# Patient Record
Sex: Male | Born: 1995 | Race: White | Hispanic: No | Marital: Married | Smoking: Never smoker
Health system: Southern US, Community
[De-identification: ages and names within clinical notes are randomized; demographics above are authoritative.]

## PROBLEM LIST (undated history)

## (undated) DIAGNOSIS — K5090 Crohn's disease, unspecified, without complications: Secondary | ICD-10-CM

## (undated) DIAGNOSIS — T7840XA Allergy, unspecified, initial encounter: Secondary | ICD-10-CM

## (undated) DIAGNOSIS — J45909 Unspecified asthma, uncomplicated: Secondary | ICD-10-CM

## (undated) HISTORY — DX: Crohn's disease, unspecified, without complications: K50.90

## (undated) HISTORY — DX: Allergy, unspecified, initial encounter: T78.40XA

---

## 2017-10-12 ENCOUNTER — Inpatient Hospital Stay
Admit: 2017-10-12 | Discharge: 2017-10-12 | Disposition: A | Discharge status: Home / Self Care | Payer: PRIVATE HEALTH INSURANCE

## 2017-10-12 ENCOUNTER — Emergency Department: Admit: 2017-10-12 | Payer: PRIVATE HEALTH INSURANCE

## 2017-10-12 DIAGNOSIS — K8020 Calculus of gallbladder without cholecystitis without obstruction: Secondary | ICD-10-CM

## 2017-10-12 LAB — CBC WITH AUTO DIFFERENTIAL
Basophils %: 0.2 %
Basophils Absolute: 0 10*3/uL (ref 0.0–0.2)
Eosinophils %: 0.1 %
Eosinophils Absolute: 0 10*3/uL (ref 0.0–0.6)
Hematocrit: 50.3 % (ref 40.5–52.5)
Hemoglobin: 17.1 g/dL (ref 13.5–17.5)
Lymphocytes %: 7.4 %
Lymphocytes Absolute: 1.2 10*3/uL (ref 1.0–5.1)
MCH: 29.4 pg (ref 26.0–34.0)
MCHC: 34 g/dL (ref 31.0–36.0)
MCV: 86.4 fL (ref 80.0–100.0)
MPV: 8.9 fL (ref 5.0–10.5)
Monocytes %: 3.7 %
Monocytes Absolute: 0.6 10*3/uL (ref 0.0–1.3)
Neutrophils %: 88.6 %
Neutrophils Absolute: 14.4 10*3/uL — ABNORMAL HIGH (ref 1.7–7.7)
Platelets: 292 10*3/uL (ref 135–450)
RBC: 5.82 M/uL (ref 4.20–5.90)
RDW: 12.1 % — ABNORMAL LOW (ref 12.4–15.4)
WBC: 16.3 10*3/uL — ABNORMAL HIGH (ref 4.0–11.0)

## 2017-10-12 LAB — COMPREHENSIVE METABOLIC PANEL
ALT: 18 U/L (ref 10–40)
AST: 14 U/L — ABNORMAL LOW (ref 15–37)
Albumin/Globulin Ratio: 1.5 (ref 1.1–2.2)
Albumin: 4.8 g/dL (ref 3.4–5.0)
Alkaline Phosphatase: 82 U/L (ref 40–129)
Anion Gap: 12 (ref 3–16)
BUN: 14 mg/dL (ref 7–20)
CO2: 24 mmol/L (ref 21–32)
Calcium: 9.8 mg/dL (ref 8.3–10.6)
Chloride: 102 mmol/L (ref 99–110)
Creatinine: 0.7 mg/dL — ABNORMAL LOW (ref 0.9–1.3)
GFR African American: >60 (ref >60)
GFR Non-African American: >60 (ref >60)
Globulin: 3.1 g/dL
Glucose: 104 mg/dL — ABNORMAL HIGH (ref 70–99)
Potassium: 4.4 mmol/L (ref 3.5–5.1)
Sodium: 138 mmol/L (ref 136–145)
Total Bilirubin: 0.4 mg/dL (ref 0.0–1.0)
Total Protein: 7.9 g/dL (ref 6.4–8.2)

## 2017-10-12 LAB — LIPASE: Lipase: 20 U/L (ref 13.0–60.0)

## 2017-10-12 MED ORDER — FAMOTIDINE 20 MG/2ML IV SOLN
20 MG/2ML | Freq: Once | INTRAVENOUS | Status: AC
Start: 2017-10-12 — End: 2017-10-12
  Administered 2017-10-12: 17:00:00 20 mg via INTRAVENOUS

## 2017-10-12 MED ORDER — SODIUM CHLORIDE 0.9 % IV BOLUS
0.9 % | Freq: Once | INTRAVENOUS | Status: AC
Start: 2017-10-12 — End: 2017-10-12
  Administered 2017-10-12: 17:00:00 1000 mL via INTRAVENOUS

## 2017-10-12 MED ORDER — NAPROXEN 500 MG PO TABS
500 MG | ORAL_TABLET | Freq: Two times a day (BID) | ORAL | 0 refills | Status: AC
Start: 2017-10-12 — End: 2017-10-22

## 2017-10-12 MED ORDER — LIDOCAINE VISCOUS 2 % MT SOLN
2 % | Freq: Once | OROMUCOSAL | Status: AC
Start: 2017-10-12 — End: 2017-10-12
  Administered 2017-10-12: 17:00:00 via ORAL

## 2017-10-12 MED ORDER — ONDANSETRON 4 MG PO TBDP
4 MG | ORAL_TABLET | Freq: Three times a day (TID) | ORAL | 0 refills | Status: AC | PRN
Start: 2017-10-12 — End: ?

## 2017-10-12 MED ORDER — ONDANSETRON HCL 4 MG/2ML IJ SOLN
4 MG/2ML | Freq: Once | INTRAMUSCULAR | Status: AC
Start: 2017-10-12 — End: 2017-10-12
  Administered 2017-10-12: 17:00:00 4 mg via INTRAVENOUS

## 2017-10-12 MED ORDER — KETOROLAC TROMETHAMINE 30 MG/ML IJ SOLN
30 MG/ML | Freq: Once | INTRAMUSCULAR | Status: AC
Start: 2017-10-12 — End: 2017-10-12
  Administered 2017-10-12: 17:00:00 30 mg via INTRAVENOUS

## 2017-10-12 MED ORDER — FAMOTIDINE 20 MG PO TABS
20 MG | ORAL_TABLET | Freq: Two times a day (BID) | ORAL | 0 refills | Status: AC
Start: 2017-10-12 — End: ?

## 2017-10-12 MED FILL — KETOROLAC TROMETHAMINE 30 MG/ML IJ SOLN: 30 mg/mL | INTRAMUSCULAR | Qty: 1

## 2017-10-12 MED FILL — SODIUM CHLORIDE 0.9 % IV SOLN: 0.9 % | INTRAVENOUS | Qty: 1000

## 2017-10-12 MED FILL — ONDANSETRON HCL 4 MG/2ML IJ SOLN: 4 MG/2ML | INTRAMUSCULAR | Qty: 2

## 2017-10-12 MED FILL — MAG-AL PLUS 200-200-20 MG/5ML PO LIQD: 200-200-20 MG/5ML | ORAL | Qty: 30

## 2017-10-12 MED FILL — FAMOTIDINE 20 MG/2ML IV SOLN: 20 MG/2ML | INTRAVENOUS | Qty: 2

## 2017-10-12 NOTE — ED Notes (Signed)
Verbal and written discharge instructions given. IV removed. Prescriptions given to patient.  Patient in stable condition, discharged home.     Drue FlirtKayla Lael Wetherbee, RN  10/12/17 [phone]

## 2017-10-12 NOTE — ED Provider Notes (Signed)
I independently performed a history and physical on Kevin Yoder.   All diagnostic, treatment, and disposition decisions were made by myself in conjunction with the mid-level provider.  I am the provider of record for this encounter.    For further details of Kevin Yoder's emergency department encounter, please see Aundra MilletMegan Burriss, NP's documentation.    Patient presents for evaluation of upper abdominal pain for past several hours.  Drove himself in today.  States that has had similar discomfort on right side of abdomen for which she was admitted to hospital in [place] 2 months ago however reports that nothing was definitively found.  States that abdominal pain began around 3 AM.  Reports seeing some taquitos just prior to going to bed.  Pain sharp and stabbing in nature.  Nauseous without vomiting.  No diarrhea or constipation.  No fevers chills.  No history of abdominal surgeries.  Physical exam of right upper quadrant tenderness.  No obvious hernias or masses.  No CVA tenderness.  Heart regular rate and rhythm without murmur, rub, or gallop.  Lungs are clear to auscultation bilaterally without wheezes, rhonchi or rales.  Patient does have a leukocytosis at 16.3 with left shift and no bands.  Right upper quadrant ultrasound with cholelithiasis without evidence to suggest cholecystitis.  On reevaluation patient feeling significantly better.  Tolerating by mouth challenge.  Does feel comfortable with discharge home in general surgery referral for further evaluation and more definitive care.  I also discussed return precautions and patient is in agreement comfortable discharge.     Kevin Yoder, [place]  10/14/17 [phone]

## 2017-10-12 NOTE — ED Provider Notes (Signed)
[facility]  ED  eMERGENCY dEPARTMENT eNCOUnter        Pt Name: Kevin Yoder  MRN: [id number]  Birthdate 12/01/1995  Date of evaluation: 10/12/2017  Provider: Antonietta Barcelona Burriss, APRN - CNP  PCP: No primary care provider on file.    This patient was seen in collaboration with Christain Sacramento PA-C.            CHIEF COMPLAINT       Chief Complaint   Patient presents with   . Abdominal Pain   . Emesis     since 3 am       HISTORY OF PRESENT ILLNESS   (Location/Symptom, Timing/Onset, Context/Setting, Quality, Duration, Modifying Factors, Severity)  Note limiting factors.     Kevin Yoder is a 22 y.o. male who presents to the emergency department with complaints of abdominal pain. Patient reports the pain woke him up out of her sleep at 3:00 this morning. Patient describes the pain as sharp and comes and goes. Patient reports the pain as all over his abdomen worse in the lower. Pain is a 8/10 at its worst and a 2/10 at its best. Patient did not take anything for pain. Patient also reports 4-5 episodes of emesis denies any blood in his vomit. Patient denies fever, chills, flank pain, dysuria, diarrhea or constipation. Patient denies any aggravating or alleviating factors.    Nursing Notes were all reviewed and agreed with or any disagreements were addressed  in the HPI.    REVIEW OF SYSTEMS    (2-9 systems for level 4, 10 or more for level 5)     Review of Systems   Constitutional: Negative for chills and fever.   HENT: Negative for congestion.    Respiratory: Negative for cough and shortness of breath.    Cardiovascular: Negative for chest pain.   Gastrointestinal: Positive for abdominal pain, nausea and vomiting. Negative for constipation and diarrhea.   Genitourinary: Negative for decreased urine volume, dysuria and flank pain.   Neurological: Negative for dizziness and light-headedness.   Psychiatric/Behavioral: Negative for agitation, behavioral problems and confusion.       Positives and Pertinent  negatives as per HPI.  PAST MEDICAL HISTORY   History reviewed. No pertinent past medical history.      SURGICAL HISTORY   History reviewed. No pertinent surgical history.      CURRENTMEDICATIONS       Discharge Medication List as of 10/12/2017 12:50 PM            ALLERGIES     Pcn [penicillins] and Zithromax [azithromycin]    FAMILYHISTORY     History reviewed. No pertinent family history.       SOCIAL HISTORY       Social History     Social History   . Marital status: Married     Spouse name: N/A   . Number of children: N/A   . Years of education: N/A     Social History Main Topics   . Smoking status: Never Smoker   . Smokeless tobacco: Never Used   . Alcohol use No   . Drug use: Unknown   . Sexual activity: Not Asked     Other Topics Concern   . None     Social History Narrative   . None       SCREENINGS    Glasgow Coma Scale  Eye Opening: Spontaneous  Best Verbal Response: Oriented  Best Motor Response: Obeys commands  Glasgow  Coma Scale Score: 15        PHYSICAL EXAM    (up to 7 for level 4, 8 or more for level 5)     ED Triage Vitals   BP Temp Temp src Pulse Resp SpO2 Height Weight   10/12/17 1033 10/12/17 1033 -- 10/12/17 1033 10/12/17 1033 10/12/17 1033 -- 10/12/17 1031   (!) 143/86 97.8 F (36.6 C)  70 14 98 %  180 lb (81.6 kg)       Physical Exam   Constitutional: He is oriented to person, place, and time. He appears well-developed and well-nourished.   HENT:   Head: Normocephalic and atraumatic.   Nose: Nose normal.   Eyes: Right eye exhibits no discharge. Left eye exhibits no discharge.   Neck: Normal range of motion. Neck supple.   Cardiovascular: Normal rate, regular rhythm, normal heart sounds and intact distal pulses.  Exam reveals no gallop and no friction rub.    No murmur heard.  Pulmonary/Chest: Effort normal and breath sounds normal. No respiratory distress. He has no wheezes. He has no rales. He exhibits no tenderness.   Abdominal: Soft. Bowel sounds are normal. He exhibits no distension. There  is tenderness in the right upper quadrant and right lower quadrant. There is no rigidity and no guarding.   Musculoskeletal: Normal range of motion.   Neurological: He is alert and oriented to person, place, and time.   Skin: Skin is warm and dry. He is not diaphoretic.   Psychiatric: He has a normal mood and affect. His behavior is normal.   Nursing note and vitals reviewed.      DIAGNOSTIC RESULTS   LABS:    Labs Reviewed   CBC WITH AUTO DIFFERENTIAL - Abnormal; Notable for the following:        Result Value    WBC 16.3 (*)     RDW 12.1 (*)     Neutrophils # 14.4 (*)     All other components within normal limits    Narrative:     Performed at:  [facility]  [address]  [place], [place] [address]   Phone [phone]   COMPREHENSIVE METABOLIC PANEL - Abnormal; Notable for the following:     Glucose 104 (*)     CREATININE 0.7 (*)     AST 14 (*)     All other components within normal limits    Narrative:     Performed at:  [facility]  [address],  [place], [place] [address]   Phone [phone]   LIPASE    Narrative:     Performed at:  [facility]  [address],  [place], [place] [address]   Phone [phone]   URINE RT REFLEX TO CULTURE       All other labs were within normal range or not returned as of this dictation.    EKG: All EKG's are interpreted by the Emergency Department Physician who either signs orCo-signs this chart in the absence of a cardiologist.  Please see their note for interpretation of EKG.      RADIOLOGY:   Non-plain film images such as CT, Ultrasound and MRI are read by the radiologist. Plain radiographic images are visualized andpreliminarily interpreted by the  ED Provider with the below findings:        Interpretation perthe Radiologist below, if available at the time of this note:  US GALLBLADDER RUQ   Final Result   Cholelithiasis.  No sonographic evidence of acute cholecystitis.       Increased hepatic echotexture suggesting fatty infiltration.           No results found.      PROCEDURES   Unless otherwise noted below, none     Procedures    CRITICAL CARE TIME   N/A    CONSULTS:  None      EMERGENCY DEPARTMENT COURSE and DIFFERENTIALDIAGNOSIS/MDM:   Vitals:    Vitals:    10/12/17 1033 10/12/17 1117 10/12/17 1225 10/12/17 1257   BP: (!) 143/86 134/82 132/76 (!) 136/98   Pulse: 70 70 88 100   Resp: 14 14 14 14    Temp: 97.8 F (36.6 C)   98.1 F (36.7 C)   TempSrc:    Oral   SpO2: 98% 99% 96%    Weight:           Patient was given thefollowing medications:  Medications   0.9 % sodium chloride bolus (0 mLs Intravenous Stopped 10/12/17 1303)   ondansetron (ZOFRAN) injection 4 mg (4 mg Intravenous Given 10/12/17 1140)   aluminum & magnesium hydroxide-simethicone (MAALOX) 30 mL, lidocaine viscous (XYLOCAINE) 5 mL (GI COCKTAIL) ( Oral Given 10/12/17 1140)   famotidine (PEPCID) injection 20 mg (20 mg Intravenous Given 10/12/17 1140)   ketorolac (TORADOL) injection 30 mg (30 mg Intravenous Given 10/12/17 1141)       Patient seen and evaluated by myself and Christain SacramentoAndrew Gaines PA-C  Vital signs stable. Patient is non toxic and well appearing. Afebrile with normal heart rate. Patient presents to the emergency department with complaints of abdominal pain and emesis. Patient reports he woke up at 3 this morning with generalized abdominal pain and emesis. Patient denies any blood in the emesis. Patient reports 4-5 episodes of emesis. Patient reports he ate a tachycardia that is around 10:30 last night before bed. Patient reports he has had this in the past with negative workups. Patient reports he was hospitalized in November 2017 with a negative CAT scan of abdomen. Patient denies fever, chills, flank pain, dysuria or diarrhea. Patient reports pain comes and goes and describes pain as a sharp pain all over her abdomen. Patient reports he did not take anything for pain. Upon physical exam abdomen is soft with normal bowel  sounds patient does have tenderness in the RUQ and RLQ. Patient also reports some epigastric discomfort. Patient given a bolus of IV fluids, IV Zofran, Pepcid, Toradol and a GI cocktail. Patient reports relief after medications. Patient did not have any emesis while in the emergency department. CBC reveals leukocytosis with a WBC of 16.3. CMP reveals normal kidney function GFR greater than 60. Lipase normal at 20. Ultrasound of gallbladder reveals cholelithiasis no evidence of acute cholecystitis. I feel this patient is safe to be discharged home with follow up to general surgery. Patient educated on low-fat diet. Patient given prescriptions for Zofran, Pepcid, naproxen. Patient verbalized understanding of follow-up with general surgery. Patient instructed to return to the emergency Department with any worsening symptoms. Patient denies any questions at this time and is agreeable with plan of care.    FINAL IMPRESSION      1. Gallstones          DISPOSITION/PLAN   DISPOSITION     PATIENT REFERREDTO:  [facility]  ED  [address]  [place] [id number]  [phone]  Go to   As needed, If  symptoms worsen    Erma Pinto Ward, MD  [address]  [address]: [place] [address]  [phone]    Call in 1 day  follow up      DISCHARGE MEDICATIONS:  Discharge Medication List as of 10/12/2017 12:50 PM      START taking these medications    Details   ondansetron (ZOFRAN ODT) 4 MG disintegrating tablet Take 1 tablet by mouth every 8 hours as needed for Nausea, Disp-20 tablet, R-0Print      famotidine (PEPCID) 20 MG tablet Take 1 tablet by mouth 2 times daily, Disp-60 tablet, R-0Print      naproxen (NAPROSYN) 500 MG tablet Take 1 tablet by mouth 2 times daily for 20 doses, Disp-20 tablet, R-0Print             DISCONTINUED MEDICATIONS:  Discharge Medication List as of 10/12/2017 12:50 PM                 (Please note that portions ofthis note were completed with a voice recognition program.  Efforts were made  to edit the dictations but occasionally words are mis-transcribed.)    Antonietta Barcelona Burriss, APRN - CNP (electronically signed)           Antonietta Barcelona Burriss, APRN - CNP  10/12/17 1326

## 2017-10-20 ENCOUNTER — Ambulatory Visit: Admit: 2017-10-20 | Discharge: 2017-10-20 | Payer: PRIVATE HEALTH INSURANCE | Attending: Surgery

## 2017-10-20 DIAGNOSIS — K8020 Calculus of gallbladder without cholecystitis without obstruction: Secondary | ICD-10-CM

## 2017-10-20 NOTE — Progress Notes (Signed)
New Patient Consult    [facility] General Surgery [name], MD    [address]  [place], [place] [address]  Phone: [phone]  Fax: [phone]    Kevin Yoder   Date of Birth:  1996-05-08    Date of Visit:  10/20/2017    Kevin Yoder  No primary care provider on file.    HPI:     Gallbladder: Patient is 22 y.o. year old male seen at request of No primary care provider on file..   Patient presents for evaluation of gallbladder problems. Problems were first noted 8 days ago. Current symptoms include nausea, vomiting, and intense abdominal pain, chills.  Denies fever, constipation or diarrhea.  Symptoms are gradually worsening.  Episodes occurred in 2016 and 2017 as well, but nothing was found.  This time did RUQ ultrasound and found stones and sludge.  Was given zofran, famotidine, and naproxen which resolved the stomach pain.  Still having slight stomach pain off and on but manageable.  Pain was worst in RUQ and radiated to whole abdomen.  Patient denies any chronic medical conditions, medications, bleeding problems, blood thinners, or family history of anesthesia problems.  Patient denies any previous surgeries.    Allergies   Allergen Reactions   . Pcn [Penicillins]    . Zithromax [Azithromycin]      Outpatient Prescriptions Marked as Taking for the 10/20/17 encounter (Office Visit) with Erma Pinto Ward, MD   Medication Sig Dispense Refill   . ondansetron (ZOFRAN ODT) 4 MG disintegrating tablet Take 1 tablet by mouth every 8 hours as needed for Nausea 20 tablet 0   . famotidine (PEPCID) 20 MG tablet Take 1 tablet by mouth 2 times daily 60 tablet 0   . naproxen (NAPROSYN) 500 MG tablet Take 1 tablet by mouth 2 times daily for 20 doses 20 tablet 0       History reviewed. No pertinent past medical history.  History reviewed. No pertinent surgical history.  History reviewed. No pertinent family history.  Social History     Social History   . Marital status: Married     Spouse  name: N/A   . Number of children: N/A   . Years of education: N/A     Occupational History   . Not on file.     Social History Main Topics   . Smoking status: Never Smoker   . Smokeless tobacco: Never Used   . Alcohol use No   . Drug use: Unknown   . Sexual activity: Not on file     Other Topics Concern   . Not on file     Social History Narrative   . No narrative on file          Vitals:    10/20/17 1536   BP: 129/78   Site: Right Wrist   Position: Sitting   Cuff Size: Medium Adult   Pulse: 80   Weight: 183 lb (83 kg)   Height: 5\' 9"  (1.753 m)     Body mass index is 27.02 kg/m.     Wt Readings from Last 3 Encounters:   10/20/17 183 lb (83 kg)   10/12/17 180 lb (81.6 kg)     BP Readings from Last 3 Encounters:   10/20/17 129/78   10/12/17 (!) 136/98          REVIEW OF SYSTEMS:    All other systems reviewed; please refer to HPI with pertinent  positives, all other ROS are negative    PHYSICAL EXAM:    CONSTITUTIONAL:  awake, alert, no apparent distress and normal weight  ENT:  normocepalic, without obvious abnormality  NECK:  supple, symmetrical, trachea midline   LUNGS:  Resp easy and unlabored  CARDIOVASCULAR:  regular rate and rhythm  ABDOMEN:  no scars, normal bowel sounds, soft, non-distended, non-tender, voluntary guarding absent, and hernia absent, negative Murphy's sign.  MUSCULOSKELETAL: No edema  NEUROLOGIC:  Mental Status Exam:  Level of Alertness:   awake  Orientation:   person, place, time        DATA:  Radiology Review:    Ultrasound RUQ:  Impression   Cholelithiasis. No sonographic evidence of acute cholecystitis.      Increased hepatic echotexture suggesting fatty infiltration.         ASSESSMENT:    Symptomatic Cholelithiasis:      PLAN:  Patient is considering watchful waiting vs. Cholecystectomy and will inform the office if he chooses to move forward with cholecystectomy.    Kevin Yoder     Surgery Staff    I have examined this patient and read and agree with the note by Kevin ColtBrandon Czar Ysaguirre, DO  from today.  I do think he is likely having biliary colic symptoms, and would thus benefit from elective cholecystectomy.  Pt indicates he will notify us when he would like to proceed.  Technical aspects of the procedure were reviewed, all questions answered, and pt indicates understands and agrees.    DAVID [employer] WARD

## 2017-10-21 DIAGNOSIS — K8020 Calculus of gallbladder without cholecystitis without obstruction: Secondary | ICD-10-CM

## 2017-10-21 NOTE — Patient Instructions (Signed)
Call when you would like to schedule lap cholecystectomy.

## 2021-03-07 ENCOUNTER — Other Ambulatory Visit: Payer: Self-pay

## 2021-03-07 ENCOUNTER — Emergency Department
Admission: EM | Admit: 2021-03-07 | Discharge: 2021-03-07 | Disposition: A | Discharge status: Home / Self Care | Payer: BLUE CROSS/BLUE SHIELD | Attending: Emergency Medicine | Admitting: Emergency Medicine

## 2021-03-07 ENCOUNTER — Emergency Department: Payer: BLUE CROSS/BLUE SHIELD

## 2021-03-07 DIAGNOSIS — R1084 Generalized abdominal pain: Secondary | ICD-10-CM

## 2021-03-07 DIAGNOSIS — K529 Noninfective gastroenteritis and colitis, unspecified: Secondary | ICD-10-CM | POA: Diagnosis not present

## 2021-03-07 DIAGNOSIS — R112 Nausea with vomiting, unspecified: Secondary | ICD-10-CM | POA: Diagnosis present

## 2021-03-07 DIAGNOSIS — Z20822 Contact with and (suspected) exposure to covid-19: Secondary | ICD-10-CM | POA: Insufficient documentation

## 2021-03-07 LAB — COMPREHENSIVE METABOLIC PANEL
ALT: 29 U/L (ref 0–44)
AST: 20 U/L (ref 15–41)
Albumin: 4.9 g/dL (ref 3.5–5.0)
Alkaline Phosphatase: 74 U/L (ref 38–126)
Anion gap: 12 (ref 5–15)
BUN: 15 mg/dL (ref 6–20)
CO2: 21 mmol/L — ABNORMAL LOW (ref 22–32)
Calcium: 10 mg/dL (ref 8.9–10.3)
Chloride: 104 mmol/L (ref 98–111)
Creatinine, Ser: 0.9 mg/dL (ref 0.61–1.24)
GFR, Estimated: >60 mL/min (ref >60)
Glucose, Bld: 119 mg/dL — ABNORMAL HIGH (ref 70–99)
Potassium: 4.3 mmol/L (ref 3.5–5.1)
Sodium: 137 mmol/L (ref 135–145)
Total Bilirubin: 0.9 mg/dL (ref 0.3–1.2)
Total Protein: 8.4 g/dL — ABNORMAL HIGH (ref 6.5–8.1)

## 2021-03-07 LAB — URINALYSIS, COMPLETE (UACMP) WITH MICROSCOPIC
Bacteria, UA: NONE SEEN
Bilirubin Urine: NEGATIVE
Glucose, UA: NEGATIVE mg/dL
Hgb urine dipstick: NEGATIVE
Ketones, ur: NEGATIVE mg/dL
Leukocytes,Ua: NEGATIVE
Nitrite: NEGATIVE
Protein, ur: 30 mg/dL — AB
Specific Gravity, Urine: 1.029 (ref 1.005–1.030)
Squamous Epithelial / HPF: NONE SEEN (ref 0–5)
pH: 5 (ref 5.0–8.0)

## 2021-03-07 LAB — RESP PANEL BY RT-PCR (FLU A&B, COVID) ARPGX2
Influenza A by PCR: NEGATIVE
Influenza B by PCR: NEGATIVE
SARS Coronavirus 2 by RT PCR: NEGATIVE

## 2021-03-07 LAB — CBC
HCT: 48.4 % (ref 39.0–52.0)
Hemoglobin: 17.3 g/dL — ABNORMAL HIGH (ref 13.0–17.0)
MCH: 29.8 pg (ref 26.0–34.0)
MCHC: 35.7 g/dL (ref 30.0–36.0)
MCV: 83.3 fL (ref 80.0–100.0)
Platelets: 322 10*3/uL (ref 150–400)
RBC: 5.81 MIL/uL (ref 4.22–5.81)
RDW: 12.1 % (ref 11.5–15.5)
WBC: 13.8 10*3/uL — ABNORMAL HIGH (ref 4.0–10.5)
nRBC: 0 % (ref 0.0–0.2)

## 2021-03-07 LAB — LIPASE, BLOOD: Lipase: 26 U/L (ref 11–51)

## 2021-03-07 MED ORDER — HYDROCODONE-ACETAMINOPHEN 5-325 MG PO TABS: 1.0000 | ORAL_TABLET | Freq: Four times a day (QID) | ORAL | 0 refills | Status: DC | PRN

## 2021-03-07 MED ORDER — MORPHINE SULFATE (PF) 4 MG/ML IV SOLN
4.0000 mg | Freq: Once | INTRAVENOUS | Status: AC
  Administered 2021-03-07: 4 mg via INTRAVENOUS
  Filled 2021-03-07: qty 1

## 2021-03-07 MED ORDER — SODIUM CHLORIDE 0.9 % IV BOLUS
1000.0000 mL | Freq: Once | INTRAVENOUS | Status: AC
  Administered 2021-03-07: 1000 mL via INTRAVENOUS

## 2021-03-07 MED ORDER — CIPROFLOXACIN HCL 500 MG PO TABS: 500.0000 mg | ORAL_TABLET | Freq: Two times a day (BID) | ORAL | 0 refills | Status: AC

## 2021-03-07 MED ORDER — METRONIDAZOLE 500 MG PO TABS: 500.0000 mg | ORAL_TABLET | Freq: Three times a day (TID) | ORAL | 0 refills | Status: AC

## 2021-03-07 MED ORDER — ONDANSETRON 4 MG PO TBDP: 4.0000 mg | ORAL_TABLET | Freq: Three times a day (TID) | ORAL | 0 refills | Status: DC | PRN

## 2021-03-07 MED ORDER — ONDANSETRON HCL 4 MG/2ML IJ SOLN
4.0000 mg | Freq: Once | INTRAMUSCULAR | Status: AC
  Administered 2021-03-07: 4 mg via INTRAVENOUS
  Filled 2021-03-07: qty 2

## 2021-03-07 NOTE — Discharge Instructions (Addendum)
Follow-up with Dr. Allegra Lai, call for an appointment if you have not heard from her by Monday Take your medications as prescribed Return if you are worsening

## 2021-03-07 NOTE — ED Triage Notes (Signed)
Pt states he started having severe abd pain this AM with n/v- pt denies urinary symptoms- pt states the pain is "all over"

## 2021-03-07 NOTE — ED Provider Notes (Signed)
[facility] Emergency Department Provider Note  ____________________________________________   Event Date/Time   First MD Initiated Contact with Patient 03/07/21 1601     (approximate)  I have reviewed the triage vital signs and the nursing notes.   HISTORY  Chief Complaint Abdominal Pain    HPI Ian Pena is a 25 y.o. male which occurred this morning.  States he has had nausea and vomiting associated with the pain.  History of gallbladder problems but still has his gallbladder.  States he hurts all over.  No fever or chills.  No chest pain or shortness of breath.  He denies diarrhea    History reviewed. No pertinent past medical history.  There are no problems to display for this patient.   History reviewed. No pertinent surgical history.  Prior to Admission medications   Medication Sig Start Date End Date Taking? Authorizing Provider  ciprofloxacin (CIPRO) 500 MG tablet Take 1 tablet (500 mg total) by mouth 2 (two) times daily for 10 days. 03/07/21 03/17/21 Yes Bishop Vanderwerf, Roselyn Bering, PA-C  HYDROcodone-acetaminophen (NORCO/VICODIN) 5-325 MG tablet Take 1 tablet by mouth every 6 (six) hours as needed for moderate pain. 03/07/21  Yes Antonea Gaut, Roselyn Bering, PA-C  metroNIDAZOLE (FLAGYL) 500 MG tablet Take 1 tablet (500 mg total) by mouth 3 (three) times daily for 7 days. 03/07/21 03/14/21 Yes Emilyanne Mcgough, Roselyn Bering, PA-C  ondansetron (ZOFRAN-ODT) 4 MG disintegrating tablet Take 1 tablet (4 mg total) by mouth every 8 (eight) hours as needed. 03/07/21  Yes Faythe Ghee, PA-C    Allergies Penicillins and Zithromax [azithromycin]  No family history on file.  Social History Social History   Tobacco Use  . Smoking status: Never Smoker    Review of Systems  Constitutional: No fever/chills Eyes: No visual changes. ENT: No sore throat. Respiratory: Denies cough Cardiovascular: Denies chest pain Gastrointestinal positive abdominal pain Genitourinary: Negative for  dysuria. Musculoskeletal: Negative for back pain. Skin: Negative for rash. Psychiatric: no mood changes,     ____________________________________________   PHYSICAL EXAM:  VITAL SIGNS: ED Triage Vitals  Enc Vitals Group     BP 03/07/21 1311 (!) 151/106     Pulse Rate 03/07/21 1311 89     Resp 03/07/21 1311 18     Temp 03/07/21 1311 98.6 F (37 C)     Temp Source 03/07/21 1311 Oral     SpO2 03/07/21 1311 97 %     Weight 03/07/21 1312 190 lb (86.2 kg)     Height 03/07/21 1312 5\' 9"  (1.753 m)     Head Circumference --      Peak Flow --      Pain Score 03/07/21 1311 10     Pain Loc --      Pain Edu? --      Excl. in GC? --     Constitutional: Alert and oriented. Well appearing and in no acute distress. Eyes: Conjunctivae are normal.  Head: Atraumatic. Nose: No congestion/rhinnorhea. Mouth/Throat: Mucous membranes are moist.   Neck:  supple no lymphadenopathy noted Cardiovascular: Normal rate, regular rhythm. Heart sounds are normal Respiratory: Normal respiratory effort.  No retractions, lungs c t a  Abd: soft tender in the right lower and upper quadrants bs normal all 4 quad GU: deferred Musculoskeletal: FROM all extremities, warm and well perfused Neurologic:  Normal speech and language.  Skin:  Skin is warm, dry and intact. No rash noted. Psychiatric: Mood and affect are normal. Speech and behavior are normal.  ____________________________________________   LABS (all labs ordered are listed, but only abnormal results are displayed)  Labs Reviewed  COMPREHENSIVE METABOLIC PANEL - Abnormal; Notable for the following components:      Result Value   CO2 21 (*)    Glucose, Bld 119 (*)    Total Protein 8.4 (*)    All other components within normal limits  CBC - Abnormal; Notable for the following components:   WBC 13.8 (*)    Hemoglobin 17.3 (*)    All other components within normal limits  URINALYSIS, COMPLETE (UACMP) WITH MICROSCOPIC - Abnormal; Notable for  the following components:   Color, Urine YELLOW (*)    APPearance CLOUDY (*)    Protein, ur 30 (*)    All other components within normal limits  RESP PANEL BY RT-PCR (FLU A&B, COVID) ARPGX2  LIPASE, BLOOD   ____________________________________________   ____________________________________________  RADIOLOGY  CT abdomen/pelvis  ____________________________________________   PROCEDURES  Procedure(s) performed: No  Procedures    ____________________________________________   INITIAL IMPRESSION / ASSESSMENT AND PLAN / ED COURSE  Pertinent labs & imaging results that were available during my care of the patient were reviewed by me and considered in my medical decision making (see chart for details).   Patient is a 25 year old male presents with abdominal pain.  See HPI.  Physical exam shows patient appears stable  DDx: Viral gastroenteritis, acute appendicitis, acute cholecystitis, kidney stone  CBC has elevated WBC of 13.8, metabolic panel is normal, urinalysis normal, lipase is normal  Due to concerns of infection with elevated WBC will order CT abdomen/pelvis, no contrast due to contrast shortage.   CT abdomen confirmed by radiology to be negative for acute appendicitis.  Radiologist comments that this may be inflammatory bowel disease.  Did discuss this finding with patient.  He states he has had several back-to-back abdominal pain episodes which at first they thought was colitis or his gallbladder.  States none of these have been accurate.  Consulted for [facility] from yesterday neurology, states that as long as he is able to tolerate p.o. can go home, oral Cipro and Flagyl.  Follow-up in her office.  I explained all the findings to the patient.  He is comfortable going home.  We will give prescription for Cipro/Flagyl/Zofran/Vicodin for pain.  Return emergency department worsening.  He was discharged in stable condition in the care of his wife.  Ian Pena was  evaluated in Emergency Department on 03/07/2021 for the symptoms described in the history of present illness. He was evaluated in the context of the global COVID-19 pandemic, which necessitated consideration that the patient might be at risk for infection with the SARS-CoV-2 virus that causes COVID-19. Institutional protocols and algorithms that pertain to the evaluation of patients at risk for COVID-19 are in a state of rapid change based on information released by regulatory bodies including the CDC and federal and state organizations. These policies and algorithms were followed during the patient's care in the ED.    As part of my medical decision making, I reviewed the following data within the electronic MEDICAL RECORD NUMBER History obtained from family, Nursing notes reviewed and incorporated, Labs reviewed , Old chart reviewed, Radiograph reviewed , A consult was requested and obtained from this/these consultant(s) gastroenterology, Notes from prior ED visits and [place] Controlled Substance Database  ____________________________________________   FINAL CLINICAL IMPRESSION(S) / ED DIAGNOSES  Final diagnoses:  Generalized abdominal pain  Inflammatory bowel diseases (IBD)      NEW MEDICATIONS  STARTED DURING THIS VISIT:  New Prescriptions   CIPROFLOXACIN (CIPRO) 500 MG TABLET    Take 1 tablet (500 mg total) by mouth 2 (two) times daily for 10 days.   HYDROCODONE-ACETAMINOPHEN (NORCO/VICODIN) 5-325 MG TABLET    Take 1 tablet by mouth every 6 (six) hours as needed for moderate pain.   METRONIDAZOLE (FLAGYL) 500 MG TABLET    Take 1 tablet (500 mg total) by mouth 3 (three) times daily for 7 days.   ONDANSETRON (ZOFRAN-ODT) 4 MG DISINTEGRATING TABLET    Take 1 tablet (4 mg total) by mouth every 8 (eight) hours as needed.     Note:  This document was prepared using Dragon voice recognition software and may include unintentional dictation errors.    Faythe Ghee, PA-C 03/07/21 1737    Shaune Pollack, MD 03/13/21 [id number]

## 2021-03-11 ENCOUNTER — Telehealth: Payer: Self-pay | Admitting: Gastroenterology

## 2021-03-11 ENCOUNTER — Other Ambulatory Visit: Payer: Self-pay

## 2021-03-11 NOTE — Telephone Encounter (Signed)
Toney Reil, MD  Zack Seal, Traci L Please schedule this pt with me, ok to overbook  Dx: IBD, urgent   LVM to call back for appointment

## 2021-03-12 ENCOUNTER — Encounter: Payer: Self-pay | Admitting: Gastroenterology

## 2021-03-12 ENCOUNTER — Ambulatory Visit (INDEPENDENT_AMBULATORY_CARE_PROVIDER_SITE_OTHER): Payer: BLUE CROSS/BLUE SHIELD | Admitting: Gastroenterology

## 2021-03-12 ENCOUNTER — Other Ambulatory Visit: Payer: Self-pay

## 2021-03-12 VITALS — BP 139/102 | HR 82 | Temp 98.3°F | Ht 69.0 in | Wt 190.1 lb

## 2021-03-12 DIAGNOSIS — T781XXA Other adverse food reactions, not elsewhere classified, initial encounter: Secondary | ICD-10-CM | POA: Insufficient documentation

## 2021-03-12 DIAGNOSIS — J4540 Moderate persistent asthma, uncomplicated: Secondary | ICD-10-CM | POA: Insufficient documentation

## 2021-03-12 DIAGNOSIS — J3081 Allergic rhinitis due to animal (cat) (dog) hair and dander: Secondary | ICD-10-CM | POA: Insufficient documentation

## 2021-03-12 DIAGNOSIS — K529 Noninfective gastroenteritis and colitis, unspecified: Secondary | ICD-10-CM

## 2021-03-12 DIAGNOSIS — H1045 Other chronic allergic conjunctivitis: Secondary | ICD-10-CM | POA: Insufficient documentation

## 2021-03-12 DIAGNOSIS — J309 Allergic rhinitis, unspecified: Secondary | ICD-10-CM | POA: Insufficient documentation

## 2021-03-12 DIAGNOSIS — J301 Allergic rhinitis due to pollen: Secondary | ICD-10-CM | POA: Insufficient documentation

## 2021-03-12 MED ORDER — GOLYTELY 236 G PO SOLR: 4000.0000 mL | Freq: Once | ORAL | 0 refills | Status: AC

## 2021-03-12 NOTE — Progress Notes (Signed)
Arlyss Repress, MD [address]  [address]  [place], [place] [address]  Main: [phone]  Fax: [phone]    Gastroenterology Consultation  Referring Provider:     No ref. provider found Primary Care Physician:  Pcp, No Primary Gastroenterologist:  Dr. Arlyss Repress Reason for Consultation: Right lower quadrant pain, nausea and vomiting        HPI:   Ian Pena is a 25 y.o. male referred by Dr. Oneita Hurt, No  for consultation & management of recent episode of right lower quadrant pain associated with nausea and vomiting.  Patient went to the ER with the symptoms on 6/2, labs revealed mild leukocytosis.  CT abdomen and pelvis revealed inflammatory changes in the majority of the ileum with fecalization of the bowel contents indicating increased transition time through this region.  Patient was discharged home on Cipro and Flagyl which she is currently taking.  Patient reports feeling well overall without any symptoms of abdominal pain, does report mild discomfort.  He denies any diarrhea or rectal bleeding.  He is able to tolerate regular diet well currently.  Apparently, patient reports that he had these episodes since 2016, once a year, most recently in January 2019.  He could not see GI as he has been traveling quite a bit.  Patient denies any sick contacts, particular association with food  He does not smoke Occasional alcohol use Reports history of Crohn's and his first cousins He works in Equities trader  NSAIDs: None  Antiplts/Anticoagulants/Anti thrombotics: None  GI Procedures: None  History reviewed. No pertinent past medical history.  History reviewed. No pertinent surgical history.  Current Outpatient Medications:  .  azelastine (OPTIVAR) 0.05 % ophthalmic solution, Apply to eye., Disp: , Rfl:  .  Azelastine-Fluticasone 137-50 MCG/ACT SUSP, Place 1 spray into both nostrils 2 (two) times daily., Disp: , Rfl:  .  BREO ELLIPTA 200-25 MCG/INH AEPB, Inhale 1 puff into  the lungs daily., Disp: , Rfl:  .  ciprofloxacin (CIPRO) 500 MG tablet, Take 1 tablet (500 mg total) by mouth 2 (two) times daily for 10 days., Disp: 20 tablet, Rfl: 0 .  HYDROcodone-acetaminophen (NORCO/VICODIN) 5-325 MG tablet, Take 1 tablet by mouth every 6 (six) hours as needed for moderate pain., Disp: 15 tablet, Rfl: 0 .  levocetirizine (XYZAL) 5 MG tablet, SMARTSIG:1 Tablet(s) By Mouth Every Evening, Disp: , Rfl:  .  ondansetron (ZOFRAN-ODT) 4 MG disintegrating tablet, Take 1 tablet (4 mg total) by mouth every 8 (eight) hours as needed., Disp: 20 tablet, Rfl: 0 .  polyethylene glycol (GOLYTELY) 236 g solution, Take 4,000 mLs by mouth once for 1 dose., Disp: 4000 mL, Rfl: 0 .  metroNIDAZOLE (FLAGYL) 500 MG tablet, Take 1 tablet (500 mg total) by mouth 3 (three) times daily for 7 days. (Patient not taking: Reported on 03/12/2021), Disp: 21 tablet, Rfl: 0   History reviewed. No pertinent family history.   Social History   Tobacco Use  . Smoking status: Never Smoker  . Smokeless tobacco: Never Used  Substance Use Topics  . Alcohol use: Yes    Comment: occ    Allergies as of 03/12/2021 - Review Complete 03/12/2021  Allergen Reaction Noted  . Penicillins  03/07/2021  . Zithromax [azithromycin]  03/07/2021    Review of Systems:    All systems reviewed and negative except where noted in HPI.   Physical Exam:  BP (!) 139/102 (BP Location: Left Arm, Patient Position: Sitting, Cuff Size: Normal)   Pulse  82   Temp 98.3 F (36.8 C) (Oral)   Ht 5\' 9"  (1.753 m)   Wt 190 lb 2 oz (86.2 kg)   BMI 28.08 kg/m  No LMP for male patient.  General:   Alert,  Well-developed, well-nourished, pleasant and cooperative in NAD Head:  Normocephalic and atraumatic. Eyes:  Sclera clear, no icterus.   Conjunctiva pink. Ears:  Normal auditory acuity. Nose:  No deformity, discharge, or lesions. Mouth:  No deformity or lesions,oropharynx pink & moist. Neck:  Supple; no masses or thyromegaly. Lungs:   Respirations even and unlabored.  Clear throughout to auscultation.   No wheezes, crackles, or rhonchi. No acute distress. Heart:  Regular rate and rhythm; no murmurs, clicks, rubs, or gallops. Abdomen:  Normal bowel sounds. Soft, mild right lower quadrant discomfort and non-distended without masses, hepatosplenomegaly or hernias noted.  No guarding or rebound tenderness.   Rectal: Not performed Msk:  Symmetrical without gross deformities. Good, equal movement & strength bilaterally. Pulses:  Normal pulses noted. Extremities:  No clubbing or edema.  No cyanosis. Neurologic:  Alert and oriented x3;  grossly normal neurologically. Skin:  Intact without significant lesions or rashes. No jaundice. Psych:  Alert and cooperative. Normal mood and affect.  Imaging Studies: Reviewed  Assessment and Plan:   Ian Pena is a 25 y.o. pleasant Caucasian male with intermittent episodes of right lower quadrant pain associated with nausea and vomiting, CT revealed inflammation of the terminal ileum Patient is currently on Cipro and Flagyl, asymptomatic  Recommend colonoscopy with TI evaluation to evaluate for small bowel Crohn's Further treatment based on the colonoscopy findings  I have discussed alternative options, risks & benefits,  which include, but are not limited to, bleeding, infection, perforation,respiratory complication & drug reaction.  The patient agrees with this plan & written consent will be obtained.    Follow up in 2 months   30, MD

## 2021-03-13 ENCOUNTER — Encounter: Payer: Self-pay | Admitting: Gastroenterology

## 2021-03-21 ENCOUNTER — Telehealth: Payer: Self-pay | Admitting: Gastroenterology

## 2021-03-21 NOTE — Telephone Encounter (Signed)
Called [facility] and they are going to move to 07/07 to [facility]

## 2021-03-21 NOTE — Telephone Encounter (Signed)
Needs to reschedule procedure.

## 2021-03-21 NOTE — Telephone Encounter (Signed)
Patient has a concert the night before his procedure and needs to reschedule to 04/11/21 at [facility]

## 2021-04-10 ENCOUNTER — Encounter: Payer: Self-pay | Admitting: Gastroenterology

## 2021-04-11 ENCOUNTER — Encounter
Admission: RE | Disposition: A | Discharge status: Home / Self Care | Payer: Self-pay | Source: Home / Self Care | Attending: Gastroenterology

## 2021-04-11 ENCOUNTER — Ambulatory Visit
Admission: RE | Admit: 2021-04-11 | Discharge: 2021-04-11 | Disposition: A | Discharge status: Home / Self Care | Payer: PRIVATE HEALTH INSURANCE | Attending: Gastroenterology | Admitting: Gastroenterology

## 2021-04-11 ENCOUNTER — Encounter: Payer: Self-pay | Admitting: Gastroenterology

## 2021-04-11 ENCOUNTER — Ambulatory Visit: Payer: Self-pay | Admitting: Anesthesiology

## 2021-04-11 ENCOUNTER — Encounter: Payer: Self-pay | Admitting: Anesthesiology

## 2021-04-11 DIAGNOSIS — R933 Abnormal findings on diagnostic imaging of other parts of digestive tract: Secondary | ICD-10-CM | POA: Diagnosis not present

## 2021-04-11 DIAGNOSIS — K529 Noninfective gastroenteritis and colitis, unspecified: Secondary | ICD-10-CM | POA: Diagnosis not present

## 2021-04-11 DIAGNOSIS — K56699 Other intestinal obstruction unspecified as to partial versus complete obstruction: Secondary | ICD-10-CM | POA: Insufficient documentation

## 2021-04-11 DIAGNOSIS — Z880 Allergy status to penicillin: Secondary | ICD-10-CM | POA: Diagnosis not present

## 2021-04-11 DIAGNOSIS — R197 Diarrhea, unspecified: Secondary | ICD-10-CM | POA: Diagnosis not present

## 2021-04-11 DIAGNOSIS — Z7951 Long term (current) use of inhaled steroids: Secondary | ICD-10-CM | POA: Insufficient documentation

## 2021-04-11 DIAGNOSIS — Z79899 Other long term (current) drug therapy: Secondary | ICD-10-CM | POA: Insufficient documentation

## 2021-04-11 DIAGNOSIS — Z881 Allergy status to other antibiotic agents status: Secondary | ICD-10-CM | POA: Insufficient documentation

## 2021-04-11 DIAGNOSIS — R1031 Right lower quadrant pain: Secondary | ICD-10-CM | POA: Insufficient documentation

## 2021-04-11 HISTORY — PX: COLONOSCOPY WITH PROPOFOL: SHX5780

## 2021-04-11 HISTORY — DX: Unspecified asthma, uncomplicated: J45.909

## 2021-04-11 SURGERY — COLONOSCOPY WITH PROPOFOL
Anesthesia: General

## 2021-04-11 MED ORDER — LIDOCAINE HCL (CARDIAC) PF 100 MG/5ML IV SOSY
PREFILLED_SYRINGE | INTRAVENOUS | Status: DC | PRN
  Administered 2021-04-11: 40 mg via INTRAVENOUS

## 2021-04-11 MED ORDER — PROPOFOL 10 MG/ML IV BOLUS
INTRAVENOUS | Status: DC | PRN
  Administered 2021-04-11: 10 mg via INTRAVENOUS
  Administered 2021-04-11 (×2): 20 mg via INTRAVENOUS
  Administered 2021-04-11: 10 mg via INTRAVENOUS
  Administered 2021-04-11: 90 mg via INTRAVENOUS

## 2021-04-11 MED ORDER — DEXMEDETOMIDINE (PRECEDEX) IN NS 20 MCG/5ML (4 MCG/ML) IV SYRINGE
PREFILLED_SYRINGE | INTRAVENOUS | Status: DC | PRN
  Administered 2021-04-11: 20 ug via INTRAVENOUS

## 2021-04-11 MED ORDER — DEXMEDETOMIDINE (PRECEDEX) IN NS 20 MCG/5ML (4 MCG/ML) IV SYRINGE
PREFILLED_SYRINGE | INTRAVENOUS | Status: AC
  Filled 2021-04-11: qty 5

## 2021-04-11 MED ORDER — PROPOFOL 500 MG/50ML IV EMUL
INTRAVENOUS | Status: DC | PRN
  Administered 2021-04-11: 150 ug/kg/min via INTRAVENOUS

## 2021-04-11 MED ORDER — PROPOFOL 500 MG/50ML IV EMUL
INTRAVENOUS | Status: AC
  Filled 2021-04-11: qty 50

## 2021-04-11 MED ORDER — SODIUM CHLORIDE 0.9 % IV SOLN
INTRAVENOUS | Status: DC
Start: 2021-04-11 — End: 2021-04-11

## 2021-04-11 MED ORDER — PROPOFOL 10 MG/ML IV BOLUS
INTRAVENOUS | Status: AC
  Filled 2021-04-11: qty 40

## 2021-04-11 NOTE — Transfer of Care (Signed)
Immediate Anesthesia Transfer of Care Note  Patient: Ian Pena  Procedure(s) Performed: Procedure(s): COLONOSCOPY WITH PROPOFOL (N/A)  Patient Location: PACU and Endoscopy Unit  Anesthesia Type:General  Level of Consciousness: sedated  Airway & Oxygen Therapy: Patient Spontanous Breathing and Patient connected to nasal cannula oxygen  Post-op Assessment: Report given to RN and Post -op Vital signs reviewed and stable  Post vital signs: Reviewed and stable  Last Vitals:  Vitals:   04/11/21 0827 04/11/21 1032  BP: 138/89   Pulse: 86   Resp: 16   Temp: (!) 36.1 C (!) 36.1 C  SpO2: 100%     Complications: No apparent anesthesia complications

## 2021-04-11 NOTE — Op Note (Signed)
[facility] Gastroenterology Patient Name: Ian Pena Procedure Date: 04/11/2021 9:22 AM MRN: [id number] Account #: [id number] Date of Birth: [birth year] Admit Type: Outpatient Age: 25 Room: [facility] ENDO ROOM 4 Gender: Male Note Status: Finalized Procedure:             Colonoscopy Indications:           This is the patient's first colonoscopy, Abdominal                         pain in the right lower quadrant, Diarrhea, Suspected                         Crohn's disease of the small bowel, Abnormal CT of the                         GI tract Providers:             Toney Reil MD, MD Referring MD:          No Local Md, MD (Referring MD) Medicines:             General Anesthesia Complications:         No immediate complications. Estimated blood loss:                         Minimal. Procedure:             Pre-Anesthesia Assessment:                        - Prior to the procedure, a History and Physical was                         performed, and patient medications and allergies were                         reviewed. The patient is competent. The risks and                         benefits of the procedure and the sedation options and                         risks were discussed with the patient. All questions                         were answered and informed consent was obtained.                         Patient identification and proposed procedure were                         verified by the physician, the nurse, the                         anesthesiologist, the anesthetist and the technician                         in the pre-procedure area in the procedure room in the  endoscopy suite. Mental Status Examination: alert and                         oriented. Airway Examination: normal oropharyngeal                         airway and neck mobility. Respiratory Examination:                         clear to auscultation. CV Examination:  normal.                         Prophylactic Antibiotics: The patient does not require                         prophylactic antibiotics. Prior Anticoagulants: The                         patient has taken no previous anticoagulant or                         antiplatelet agents. ASA Grade Assessment: II - A                         patient with mild systemic disease. After reviewing                         the risks and benefits, the patient was deemed in                         satisfactory condition to undergo the procedure. The                         anesthesia plan was to use general anesthesia.                         Immediately prior to administration of medications,                         the patient was re-assessed for adequacy to receive                         sedatives. The heart rate, respiratory rate, oxygen                         saturations, blood pressure, adequacy of pulmonary                         ventilation, and response to care were monitored                         throughout the procedure. The physical status of the                         patient was re-assessed after the procedure.                        After obtaining informed consent, the colonoscope was  passed under direct vision. Throughout the procedure,                         the patient's blood pressure, pulse, and oxygen                         saturations were monitored continuously. The                         Colonoscope was introduced through the anus and                         advanced to the the cecum, identified by appendiceal                         orifice and ileocecal valve. The colonoscopy was                         performed without difficulty. The patient tolerated                         the procedure well. The quality of the bowel                         preparation was adequate. Findings:      The perianal and digital rectal examinations were normal.  Pertinent       negatives include normal sphincter tone and no palpable rectal lesions.      A benign-appearing, intrinsic moderate stenosis measuring less than one       cm (in length) was found at the ileocecal valve and was non-traversed. A       TTS dilator was passed through the scope. Dilation with an 05-14-09 mm and       a 07-17-11 mm colonic balloon dilator was performed. The dilation site       was examined following endoscope reinsertion and showed mild mucosal       disruption and mild improvement in luminal narrowing. Estimated blood       loss: none. Biopsies were taken with a cold forceps for histology. The       scope could not be traversed through stricture after dilation. Switched       to upper endoscope, unable to traverse the stricture despite several       attempts and changing patient's position, applying abdominal pressure      Normal mucosa was found in the rectum, in the left colon and in the       right colon. Biopsies were taken with a cold forceps for histology.      The retroflexed view of the distal rectum and anal verge was normal and       showed no anal or rectal abnormalities. Impression:            - Stricture at the ileocecal valve. Dilated. Biopsied.                        - Normal mucosa in the rectum, in the left colon and                         in the right colon. Biopsied.                        -  The distal rectum and anal verge are normal on                         retroflexion view. Recommendation:        - Discharge patient to home (with escort).                        - Resume previous diet today.                        - Continue present medications.                        - Await pathology results.                        - Return to my office as previously scheduled. Procedure Code(s):     --- Professional ---                        [id number], Colonoscopy, flexible; with transendoscopic                         balloon dilation                         [phone], Colonoscopy, flexible; with biopsy, single or                         multiple Diagnosis Code(s):     --- Professional ---                        U98.119K56.699, Other intestinal obstruction unspecified as                         to partial versus complete obstruction                        R10.31, Right lower quadrant pain                        R19.7, Diarrhea, unspecified                        R93.3, Abnormal findings on diagnostic imaging of                         other parts of digestive tract CPT copyright 2019 American Medical Association. All rights reserved. The codes documented in this report are preliminary and upon coder review may  be revised to meet current compliance requirements. Dr. Libby Mawonini Zoa Dowty Toney Reilohini Reddy Jaspreet Hollings MD, MD 04/11/2021 10:32:51 AM This report has been signed electronically. Number of Addenda: 0 Note Initiated On: 04/11/2021 9:22 AM Scope Withdrawal Time: 0 hours 57 minutes 22 seconds  Total Procedure Duration: 1 hour 0 minutes 9 seconds  Estimated Blood Loss:  Estimated blood loss was minimal.      [facility]

## 2021-04-11 NOTE — Anesthesia Preprocedure Evaluation (Signed)
Anesthesia Evaluation  Patient identified by MRN, date of birth, ID band Patient awake    Reviewed: Allergy & Precautions, H&P , NPO status , Patient's Chart, lab work & pertinent test results, reviewed documented beta blocker date and time   Airway Mallampati: I  TM Distance: >3 FB Neck ROM: full    Dental  (+) Dental Advidsory Given, Teeth Intact, Caps   Pulmonary neg shortness of breath, asthma , neg sleep apnea, neg COPD, Recent URI  (sinus stuff right now),    Pulmonary exam normal breath sounds clear to auscultation       Cardiovascular Exercise Tolerance: Good negative cardio ROS Normal cardiovascular exam Rhythm:regular Rate:Normal     Neuro/Psych negative neurological ROS  negative psych ROS   GI/Hepatic negative GI ROS, Neg liver ROS,   Endo/Other  negative endocrine ROS  Renal/GU negative Renal ROS  negative genitourinary   Musculoskeletal   Abdominal   Peds  Hematology negative hematology ROS (+)   Anesthesia Other Findings Past Medical History: No date: Asthma   Reproductive/Obstetrics negative OB ROS                             Anesthesia Physical Anesthesia Plan  ASA: 2  Anesthesia Plan: General   Post-op Pain Management:    Induction: Intravenous  PONV Risk Score and Plan: 2 and TIVA and Propofol infusion  Airway Management Planned: Natural Airway and Nasal Cannula  Additional Equipment:   Intra-op Plan:   Post-operative Plan:   Informed Consent: I have reviewed the patients History and Physical, chart, labs and discussed the procedure including the risks, benefits and alternatives for the proposed anesthesia with the patient or authorized representative who has indicated his/her understanding and acceptance.     Dental Advisory Given  Plan Discussed with: Anesthesiologist, CRNA and Surgeon  Anesthesia Plan Comments:         Anesthesia Quick  Evaluation

## 2021-04-11 NOTE — Anesthesia Postprocedure Evaluation (Signed)
Anesthesia Post Note  Patient: Ian Pena  Procedure(s) Performed: COLONOSCOPY WITH PROPOFOL  Patient location during evaluation: Endoscopy Anesthesia Type: General Level of consciousness: awake and alert Pain management: pain level controlled Vital Signs Assessment: post-procedure vital signs reviewed and stable Respiratory status: spontaneous breathing, nonlabored ventilation, respiratory function stable and patient connected to nasal cannula oxygen Cardiovascular status: blood pressure returned to baseline and stable Postop Assessment: no apparent nausea or vomiting Anesthetic complications: no   No notable events documented.   Last Vitals:  Vitals:   04/11/21 1050 04/11/21 1100  BP: 107/78 (!) 133/100  Pulse: 65 74  Resp: 12 16  Temp:    SpO2: 99% 96%    Last Pain:  Vitals:   04/11/21 1032  TempSrc: Temporal  PainSc: Asleep                 Lenard Simmer

## 2021-04-11 NOTE — H&P (Signed)
  Arlyss Repress, MD [address]  [address]  [place], [place] [address]  Main: [phone]  Fax: [phone] Pager: [phone]  Primary Care Physician:  Pcp, No Primary Gastroenterologist:  Dr. Arlyss Repress  Pre-Procedure History & Physical: HPI:  Ian Pena is a 25 y.o. male is here for an colonoscopy.   Past Medical History:  Diagnosis Date   Asthma     History reviewed. No pertinent surgical history.  Prior to Admission medications   Medication Sig Start Date End Date Taking? Authorizing Provider  azelastine (OPTIVAR) 0.05 % ophthalmic solution Apply to eye. 12/29/20  Yes [provider]  Azelastine-Fluticasone 137-50 MCG/ACT SUSP Place 1 spray into both nostrils 2 (two) times daily. 03/03/21  Yes [provider]  BREO ELLIPTA 200-25 MCG/INH AEPB Inhale 1 puff into the lungs daily. 01/02/21  Yes [provider]  HYDROcodone-acetaminophen (NORCO/VICODIN) 5-325 MG tablet Take 1 tablet by mouth every 6 (six) hours as needed for moderate pain. 03/07/21  Yes Fisher, Roselyn Bering, PA-C  levocetirizine (XYZAL) 5 MG tablet SMARTSIG:1 Tablet(s) By Mouth Every Evening 01/02/21  Yes [provider]  ondansetron (ZOFRAN-ODT) 4 MG disintegrating tablet Take 1 tablet (4 mg total) by mouth every 8 (eight) hours as needed. 03/07/21  Yes Faythe Ghee, PA-C    Allergies as of 03/12/2021 - Review Complete 03/12/2021  Allergen Reaction Noted   Penicillins  03/07/2021   Zithromax [azithromycin]  03/07/2021    History reviewed. No pertinent family history.  Social History   Socioeconomic History   Marital status: Married    Spouse name: Not on file   Number of children: Not on file   Years of education: Not on file   Highest education level: Not on file  Occupational History   Not on file  Tobacco Use   Smoking status: Never   Smokeless tobacco: Never  Vaping Use   Vaping Use: Never used  Substance and Sexual Activity   Alcohol use: Yes     Comment: occ   Drug use: Yes   Sexual activity: Yes  Other Topics Concern   Not on file  Social History Narrative   Not on file   Social Determinants of Health   Financial Resource Strain: Not on file  Food Insecurity: Not on file  Transportation Needs: Not on file  Physical Activity: Not on file  Stress: Not on file  Social Connections: Not on file  Intimate Partner Violence: Not on file    Review of Systems: See HPI, otherwise negative ROS  Physical Exam: BP 138/89   Pulse 86   Temp (!) 97 F (36.1 C) (Temporal)   Resp 16   Ht 5\' 9"  (1.753 m)   Wt 86.2 kg   SpO2 100%   BMI 28.06 kg/m  General:   Alert,  pleasant and cooperative in NAD Head:  Normocephalic and atraumatic. Neck:  Supple; no masses or thyromegaly. Lungs:  Clear throughout to auscultation.    Heart:  Regular rate and rhythm. Abdomen:  Soft, nontender and nondistended. Normal bowel sounds, without guarding, and without rebound.   Neurologic:  Alert and  oriented x4;  grossly normal neurologically.  Impression/Plan: Ian Pena is here for an colonoscopy to be performed for h/o enteritis  Risks, benefits, limitations, and alternatives regarding  colonoscopy have been reviewed with the patient.  Questions have been answered.  All parties agreeable.   Peterson Ao, MD  04/11/2021, 8:50 AM

## 2021-04-12 ENCOUNTER — Encounter: Payer: Self-pay | Admitting: Gastroenterology

## 2021-04-12 LAB — SURGICAL PATHOLOGY

## 2021-04-15 ENCOUNTER — Other Ambulatory Visit: Payer: Self-pay | Admitting: Gastroenterology

## 2021-04-15 ENCOUNTER — Other Ambulatory Visit: Payer: Self-pay

## 2021-04-15 DIAGNOSIS — K50012 Crohn's disease of small intestine with intestinal obstruction: Secondary | ICD-10-CM

## 2021-04-16 ENCOUNTER — Telehealth: Payer: Self-pay

## 2021-04-16 DIAGNOSIS — K50012 Crohn's disease of small intestine with intestinal obstruction: Secondary | ICD-10-CM

## 2021-04-16 MED ORDER — BUDESONIDE 3 MG PO CPEP: 9.0000 mg | ORAL_CAPSULE | Freq: Every day | ORAL | 0 refills | Status: DC

## 2021-04-16 NOTE — Telephone Encounter (Signed)
-----   Message from Toney Reil, MD sent at 04/15/2021 12:58 PM EDT ----- Pathology results confirm presence of small bowel crohn's. Recommend to start entocort 3mg  3pills daily in the morning for 1 month Ordered labs Also, recommend MR enterography abdomen and pelvis Will see him for follow up on 7/21 to discuss next steps  RV

## 2021-04-16 NOTE — Telephone Encounter (Signed)
Patient verbalized understanding of results. He states he will come get lab work as soon as he can. Sent medication to the pharmacy and order the MR enterography. Called central scheduling and got patient schedule for 04/26/2021 [place] Nothing to eat or drink after midnight. Patient verbalized understanding

## 2021-04-25 ENCOUNTER — Ambulatory Visit: Payer: PRIVATE HEALTH INSURANCE | Admitting: Gastroenterology

## 2021-04-26 ENCOUNTER — Ambulatory Visit
Admission: RE | Admit: 2021-04-26 | Discharge: 2021-04-26 | Disposition: A | Discharge status: Home / Self Care | Payer: BLUE CROSS/BLUE SHIELD | Source: Ambulatory Visit | Attending: Gastroenterology | Admitting: Gastroenterology

## 2021-04-26 ENCOUNTER — Other Ambulatory Visit: Payer: Self-pay

## 2021-04-26 DIAGNOSIS — K50012 Crohn's disease of small intestine with intestinal obstruction: Secondary | ICD-10-CM

## 2021-04-26 MED ORDER — GADOBUTROL 1 MMOL/ML IV SOLN
9.0000 mL | Freq: Once | INTRAVENOUS | Status: AC | PRN
  Administered 2021-04-26: 9 mL via INTRAVENOUS

## 2021-04-29 ENCOUNTER — Telehealth: Payer: Self-pay

## 2021-04-29 NOTE — Telephone Encounter (Signed)
Patient verbalized understanding of results. He states he will try to get labs done as soon as he can. Made him a appointment for 05/20/2021

## 2021-04-29 NOTE — Telephone Encounter (Signed)
-----   Message from Toney Reil, MD sent at 04/29/2021  8:36 AM EDT ----- Please inform patient that his MRI results also shows that his crohn's disease is within the terminal ileum. I'm still waiting on his labs and please make a follow up appt to see me ASAP  Thanks RV

## 2021-05-08 ENCOUNTER — Other Ambulatory Visit: Payer: Self-pay | Admitting: Gastroenterology

## 2021-05-08 NOTE — Telephone Encounter (Signed)
Results from colonoscopy Recommend to start entocort 3mg  3pills daily in the morning for 1 month  Last refill 04/16/21 0 refills  Has appointment 05/20/2021  No show 04/25/2021

## 2021-05-20 ENCOUNTER — Other Ambulatory Visit: Payer: Self-pay

## 2021-05-20 ENCOUNTER — Ambulatory Visit (INDEPENDENT_AMBULATORY_CARE_PROVIDER_SITE_OTHER): Payer: BLUE CROSS/BLUE SHIELD | Admitting: Gastroenterology

## 2021-05-20 ENCOUNTER — Encounter: Payer: Self-pay | Admitting: Gastroenterology

## 2021-05-20 VITALS — BP 162/99 | HR 79 | Temp 98.3°F | Ht 69.0 in | Wt 191.2 lb

## 2021-05-20 DIAGNOSIS — K50012 Crohn's disease of small intestine with intestinal obstruction: Secondary | ICD-10-CM | POA: Diagnosis not present
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
## 2021-05-24 ENCOUNTER — Telehealth: Payer: Self-pay

## 2021-05-24 LAB — CALPROTECTIN, FECAL: Calprotectin, Fecal: 27 ug/g (ref 0–120)

## 2021-05-24 NOTE — Telephone Encounter (Signed)
Called no answer left message for a call back

## 2021-05-24 NOTE — Telephone Encounter (Signed)
-----   Message from Toney Reil, MD sent at 05/24/2021  8:26 AM EDT ----- Please inform patient that most of his labs came back normal. Just waiting on TB test results before applying for humira  RV

## 2021-05-26 LAB — IRON,TIBC AND FERRITIN PANEL
Ferritin: 59 ng/mL (ref 30–400)
Iron Saturation: 34 % (ref 15–55)
Iron: 103 ug/dL (ref 38–169)
Total Iron Binding Capacity: 300 ug/dL (ref 250–450)
UIBC: 197 ug/dL (ref 111–343)

## 2021-05-26 LAB — C-REACTIVE PROTEIN: CRP: 3 mg/L (ref 0–10)

## 2021-05-26 LAB — HEPATITIS C ANTIBODY: Hep C Virus Ab: <0.1 s/co ratio (ref 0.0–0.9)

## 2021-05-26 LAB — QUANTIFERON-TB GOLD PLUS
QuantiFERON Mitogen Value: >10 IU/mL
QuantiFERON Nil Value: 0 IU/mL
QuantiFERON TB1 Ag Value: 0.07 IU/mL
QuantiFERON TB2 Ag Value: 0 IU/mL
QuantiFERON-TB Gold Plus: NEGATIVE

## 2021-05-26 LAB — HEPATITIS B SURFACE ANTIBODY,QUALITATIVE: Hep B Surface Ab, Qual: NONREACTIVE

## 2021-05-26 LAB — THIOPURINE METHYLTRANSFERASE (TPMT), RBC: TPMT Activity:: 19 Units/mL RBC

## 2021-05-26 LAB — HEPATITIS A ANTIBODY, TOTAL: hep A Total Ab: NEGATIVE

## 2021-05-26 LAB — HEPATITIS B CORE ANTIBODY, TOTAL: Hep B Core Total Ab: NEGATIVE

## 2021-05-26 LAB — HEPATITIS B SURFACE ANTIGEN: Hepatitis B Surface Ag: NEGATIVE

## 2021-05-26 LAB — HIV ANTIBODY (ROUTINE TESTING W REFLEX): HIV Screen 4th Generation wRfx: NONREACTIVE

## 2021-05-27 NOTE — Telephone Encounter (Signed)
Patient verbalized understanding of results. Filled out paper work and fax to [facility] and Biomedical engineer.

## 2021-05-27 NOTE — Telephone Encounter (Signed)
-----   Message from Toney Reil, MD sent at 05/27/2021  4:07 PM EDT ----- TB test also came back negative.  Lets go ahead and apply for Humira, fresh start with standard dose DX: Small bowel Crohn's  RV

## 2021-05-29 ENCOUNTER — Other Ambulatory Visit: Payer: Self-pay

## 2021-05-29 MED ORDER — HUMIRA-CD/UC/HS STARTER 40 MG/0.8ML ~~LOC~~ PNKT: PEN_INJECTOR | SUBCUTANEOUS | 0 refills | Status: DC

## 2021-05-29 MED ORDER — HUMIRA (2 SYRINGE) 40 MG/0.4ML ~~LOC~~ PSKT: 1.0000 "pen " | PREFILLED_SYRINGE | SUBCUTANEOUS | 10 refills | Status: DC

## 2021-05-29 NOTE — Progress Notes (Signed)
[facility] needs prescrption for starter does Humira and maintenance does .

## 2021-06-04 ENCOUNTER — Telehealth: Payer: Self-pay

## 2021-06-04 NOTE — Telephone Encounter (Signed)
Filled out Humira complete form and fax over because patient is getting his medication through [facility]. The pharmacy should be reaching out to him in the next day or so about shipment of medication

## 2021-06-11 ENCOUNTER — Other Ambulatory Visit: Payer: Self-pay | Admitting: Gastroenterology

## 2021-06-11 NOTE — Telephone Encounter (Signed)
Last office visit 05/20/2021 crohn's disease  Last refill 05/14/2021 0 refills  2 month follow up

## 2021-06-25 ENCOUNTER — Other Ambulatory Visit: Payer: Self-pay | Admitting: Gastroenterology

## 2021-08-20 ENCOUNTER — Ambulatory Visit: Payer: BLUE CROSS/BLUE SHIELD | Admitting: Gastroenterology

## 2021-08-20 ENCOUNTER — Encounter: Payer: Self-pay | Admitting: Gastroenterology

## 2021-12-17 ENCOUNTER — Other Ambulatory Visit: Payer: Self-pay | Admitting: Gastroenterology

## 2022-04-21 IMAGING — MR MR [PERSON_NAME] PELVIS W/CM
16 series · 48 of 48 positions shown · IV contrast (gadavist)
Comparison: CT scan 03/07/2021

CLINICAL DATA: Intermittent right lower quadrant pain, nausea and
vomiting. New diagnosis of Crohn's disease.

EXAM:
MR ABDOMEN AND PELVIS WITHOUT AND WITH CONTRAST (MR ENTEROGRAPHY)
TECHNIQUE: Multiplanar, multisequence MRI of the abdomen and pelvis was
performed both before and during bolus administration of intravenous
contrast. Negative oral contrast VoLumen was given.
CONTRAST:  9mL GADAVIST GADOBUTROL 1 MMOL/ML IV SOLN

[Series 7: t2_haste_tra_p2_mbh_comp · axial · 5.0mm · 1.25mm/px · z∈[-230,+225]mm · 3 of 92 slices shown]
[im 1/92]
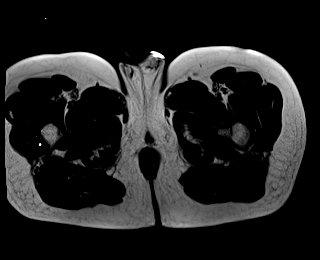
[im 46/92]
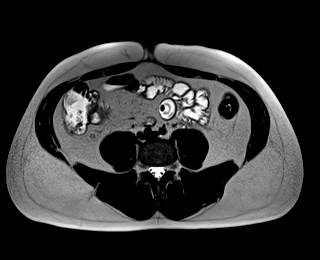
[im 92/92]
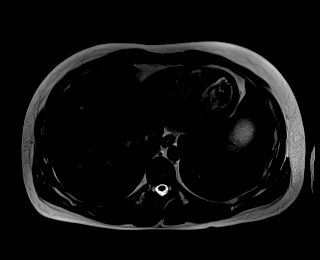

[Series 8: cor haste mbh · coronal · 5.0mm · 0.91mm/px · 2 of 41 slices shown]
[im 1/41]
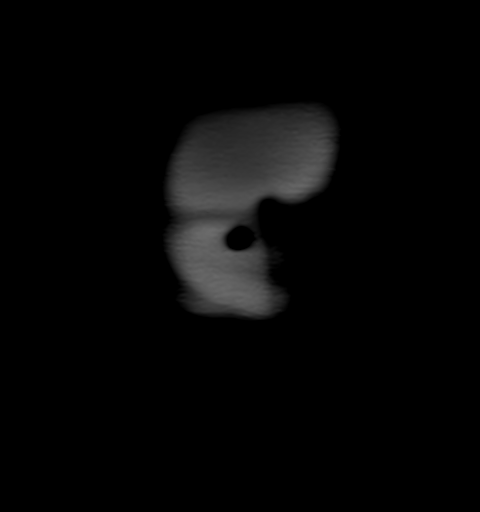
[im 41/41]
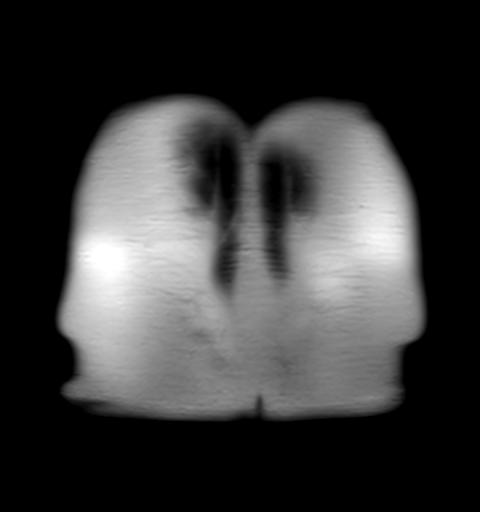

[Series 13: ax dwi_adc_comp · axial · 5.0mm · 1.49mm/px · z∈[-252,+203]mm · 2 of 92 slices shown]
[im 1/92]
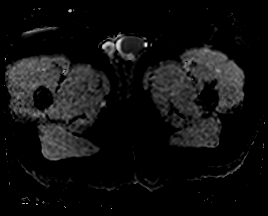
[im 92/92]
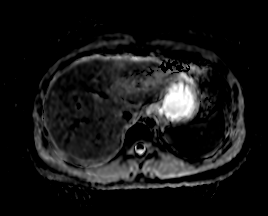

[Series 14: ax dwi_tracew_comp · axial · 5.0mm · 1.49mm/px · z∈[-252,+203]mm · 7 of 276 slices shown]
[im 1/276]
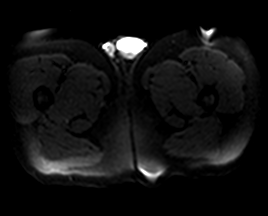
[im 46/276]
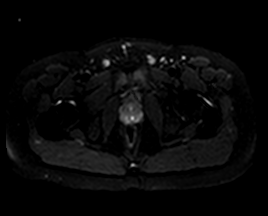
[im 92/276]
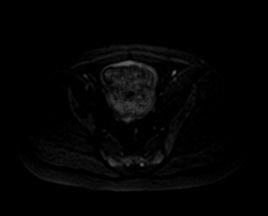
[im 138/276]
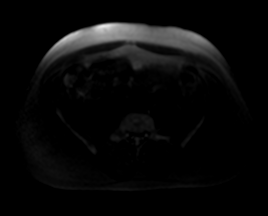
[im 184/276]
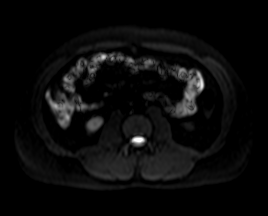
[im 230/276]
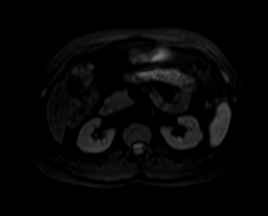
[im 276/276]
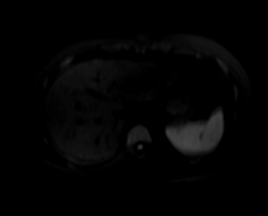

[Series 15: cor true fisp · coronal · 5.0mm · 0.74mm/px · 1 of 41 slices shown]
[im 1/41]
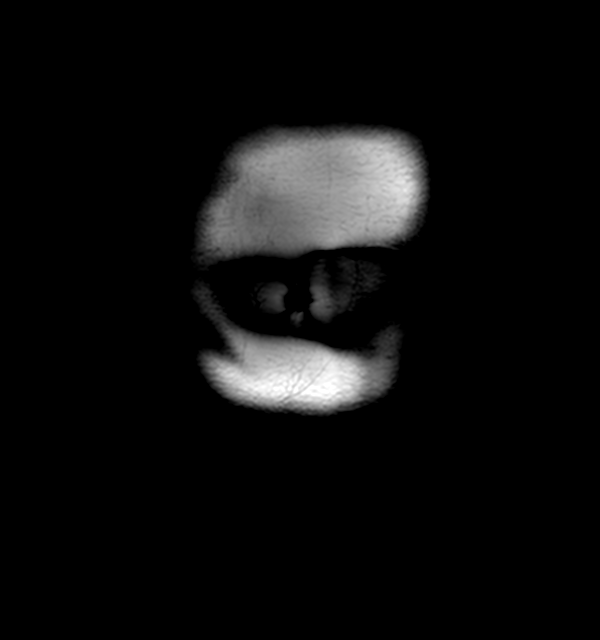

[Series 16: T1 dynamic · axial · 3.0mm · 1.64mm/px · z∈[-187,+194]mm · 3 of 128 slices shown (1 of 11)]
[im 1/128]
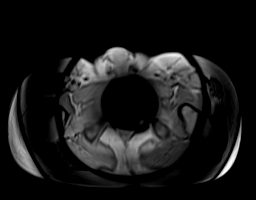
[im 64/128]
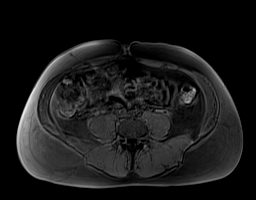
[im 128/128]
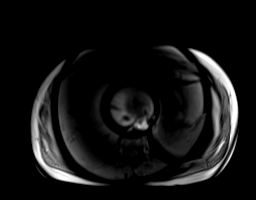

[Series 17: T1 dynamic · axial · 3.0mm · 1.64mm/px · z∈[-187,+194]mm · 3 of 128 slices shown (2 of 11)]
[im 1/128]
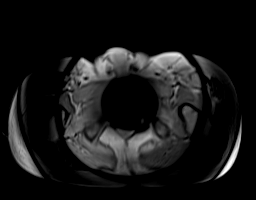
[im 64/128]
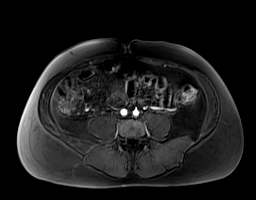
[im 128/128]
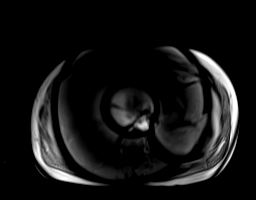

[Series 18: T1 dynamic · axial · 3.0mm · 1.64mm/px · z∈[-169,+194]mm · 3 of 122 slices shown (3 of 11)]
[im 1/122]
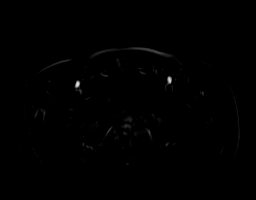
[im 61/122]
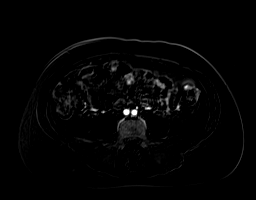
[im 122/122]
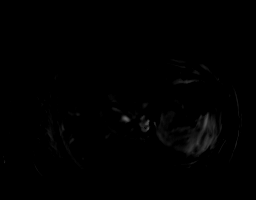

[Series 19: T1 dynamic · axial · 3.0mm · 1.64mm/px · z∈[-187,+194]mm · 3 of 128 slices shown (4 of 11)]
[im 1/128]
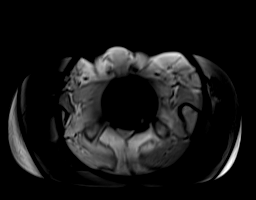
[im 64/128]
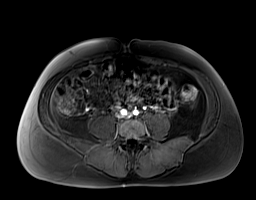
[im 128/128]
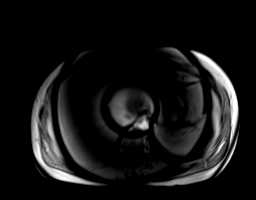

[Series 20: T1 dynamic · axial · 3.0mm · 1.64mm/px · z∈[-187,+194]mm · 3 of 127 slices shown (5 of 11)]
[im 1/127]
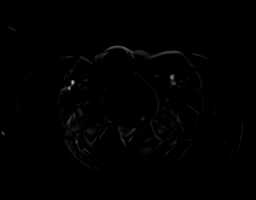
[im 64/127]
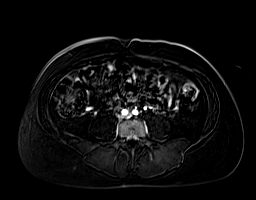
[im 127/127]
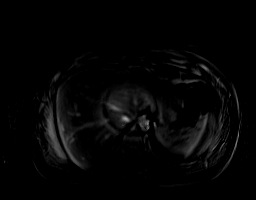

[Series 21: T1 dynamic · axial · 3.0mm · 1.64mm/px · z∈[-187,+194]mm · 3 of 128 slices shown (6 of 11)]
[im 1/128]
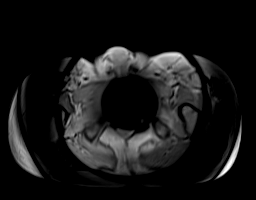
[im 64/128]
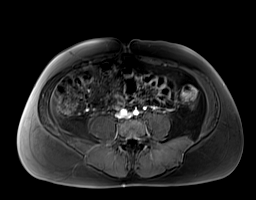
[im 128/128]
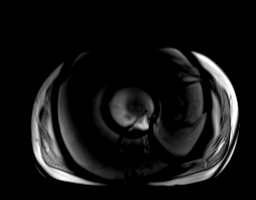

[Series 22: T1 dynamic · axial · 3.0mm · 1.64mm/px · z∈[-187,+194]mm · 3 of 128 slices shown (7 of 11)]
[im 1/128]
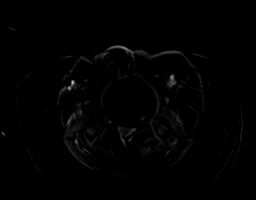
[im 64/128]
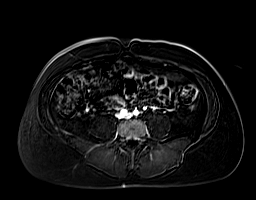
[im 128/128]
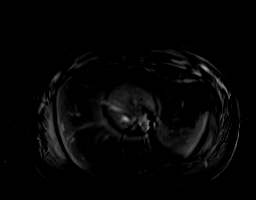

[Series 23: T1 dynamic · coronal · 1.6mm · 1.86mm/px · 3 of 128 slices shown (8 of 11)]
[im 1/128]
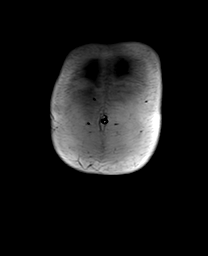
[im 64/128]
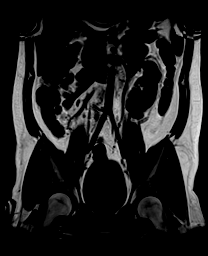
[im 128/128]
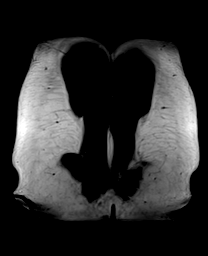

[Series 24: T1 dynamic · coronal · 1.6mm · 1.86mm/px · 3 of 128 slices shown (9 of 11)]
[im 1/128]
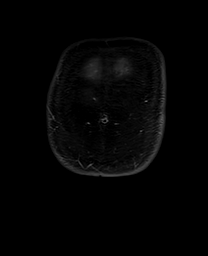
[im 64/128]
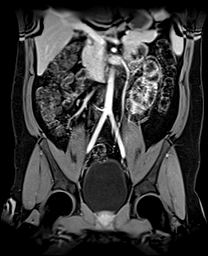
[im 128/128]
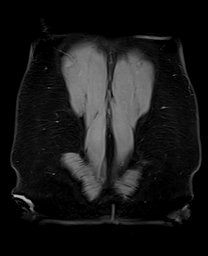

[Series 25: T1 dynamic · axial · 3.0mm · 1.64mm/px · z∈[-187,+194]mm · 3 of 128 slices shown (10 of 11)]
[im 1/128]
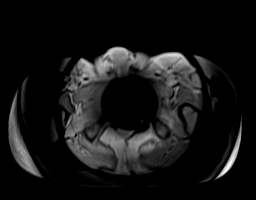
[im 64/128]
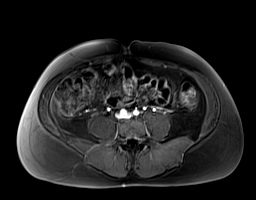
[im 128/128]
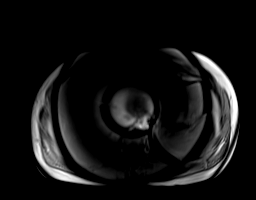

[Series 26: T1 dynamic · axial · 3.0mm · 1.64mm/px · z∈[-187,+194]mm · 3 of 128 slices shown (11 of 11)]
[im 1/128]
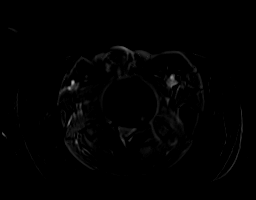
[im 64/128]
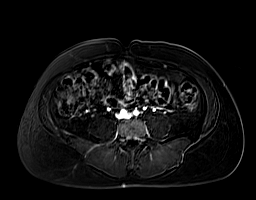
[im 128/128]
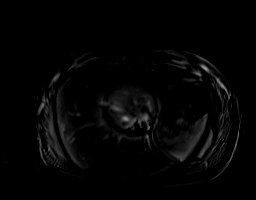

[48 of 48 positions shown; findings below may reference images not displayed]

FINDINGS: COMBINED FINDINGS FOR BOTH MR ABDOMEN AND PELVIS

Lower chest: The visualized lung bases are grossly clear. Heart is
normal in size. No pericardial effusion.

Hepatobiliary: No hepatic lesions or intrahepatic biliary
dilatation. The gallbladder is normal. Normal caliber and course of
the common bile duct.

Pancreas:  No mass, inflammation or ductal dilatation.

Spleen: Normal size. No lesions. Moderate-sized accessory spleen
noted in the splenic hilum.

Adrenals/Urinary Tract: Adrenal glands and kidneys are normal. The
bladder is normal.

Stomach/Bowel: The stomach, duodenum and most of the small bowel is
normal.

The region of the distal and terminal ileum is abnormal. There is
thickening and low T2 signal intensity suggesting fibrosis/scarring.
There is also abnormal contrast enhancement. This could be active
inflammation or it could be related to the recent dilatation
procedure.

The cecum is unremarkable. No colonic abnormalities are identified.

Vascular/Lymphatic: The aorta and branch vessels are normal. No
abdominal or pelvic lymphadenopathy.

Reproductive: Prostate gland and seminal vesicles are.

Other:  No ascites.

Musculoskeletal: No significant bony findings.
IMPRESSION: 1. Abnormal appearance of the distal and terminal ileum with wall
thickening and low T2 signal intensity suggesting fibrosis/scarring.
There is also abnormal contrast enhancement suggesting acute
inflammation or possible postprocedural changes.
2. No colonic abnormalities are identified.

## 2022-07-23 ENCOUNTER — Encounter: Payer: Self-pay | Admitting: Internal Medicine

## 2022-07-23 ENCOUNTER — Ambulatory Visit (INDEPENDENT_AMBULATORY_CARE_PROVIDER_SITE_OTHER): Payer: BLUE CROSS/BLUE SHIELD | Admitting: Internal Medicine

## 2022-07-23 VITALS — BP 126/80 | HR 96 | Temp 97.7°F | Resp 16 | Ht 70.0 in | Wt 194.7 lb

## 2022-07-23 DIAGNOSIS — Z9109 Other allergy status, other than to drugs and biological substances: Secondary | ICD-10-CM

## 2022-07-23 DIAGNOSIS — R55 Syncope and collapse: Secondary | ICD-10-CM

## 2022-07-23 DIAGNOSIS — L732 Hidradenitis suppurativa: Secondary | ICD-10-CM | POA: Diagnosis not present

## 2022-07-23 DIAGNOSIS — K5010 Crohn's disease of large intestine without complications: Secondary | ICD-10-CM | POA: Diagnosis not present

## 2022-07-23 DIAGNOSIS — E559 Vitamin D deficiency, unspecified: Secondary | ICD-10-CM

## 2022-07-23 DIAGNOSIS — Z23 Encounter for immunization: Secondary | ICD-10-CM | POA: Diagnosis not present

## 2022-07-23 DIAGNOSIS — J4540 Moderate persistent asthma, uncomplicated: Secondary | ICD-10-CM

## 2022-07-23 DIAGNOSIS — Z13220 Encounter for screening for lipoid disorders: Secondary | ICD-10-CM

## 2022-07-23 MED ORDER — CLINDAMYCIN PHOSPHATE 1 % EX GEL: Freq: Two times a day (BID) | CUTANEOUS | 0 refills | Status: AC

## 2022-07-23 NOTE — Progress Notes (Signed)
New Patient Office Visit  Subjective    Patient ID: Ian Pena, male    DOB: 09/14/1996  Age: 26 y.o. MRN: [id number]  CC:  Chief Complaint  Patient presents with   Establish Care   Dizziness    In warm situations like hot tub/shower   Recurrent Skin Infections    Possibly has hs in groin area     HPI Ian Pena presents to establish care. Having 2 acute issues today.   The first of which is boils/cysts he occasionally gets in his groin area. This started when he was in college. It happens once every few months. He will get one at a time but they can become large and painful. Sometimes purulent material can be expressed, other times just blood. They last for a few days and will resolve on their own usually.   He also has lightheadedness, dizziness and feels like he is going to pass out episodically. He feels this way in hot showers, hot tubs but mostly prior to sexual activity. He denies symptoms after ejaculation. This has occurred about 5 separate times. He has not fully lost consciousness but feels like he is going to.   Crohn's Diease: -Had been following with GI, last seen 05/20/21.  -Was formally diagnosed in the summer 2022, however he had been having episodes of lower abdominal pain and severe vomiting multiple times throughout his early 19s. -CT abdomen and pelvis from 03/07/2021 showing inflammation at the terminal ileum, colonoscopy confirmed stricturing phenotype. -Colonoscopy from 04/11/2021 showing strictures inflammation -Had been on Budesonide for symptoms but is no longer on this because he felt like symptoms were worse on the medication. -He is not currently on any treatment for Crohn's disease.  He states occasionally he will have a cramping right lower quadrant pain but overall no changes in bowel movements, caliber of stools.  He does occasionally have small amount of blood on the toilet paper but denies dark or bloody stools.  Asthma:  -Asthma status:  controlled -Current Treatments: Breo-Ellipta daily, Albuterol PRN -Satisfied with current treatment?: yes -Albuterol/rescue inhaler frequency: rarely, most with spring or with exercise  -Dyspnea frequency: Rarely -Wheezing frequency: Rarely -Cough frequency: Rarely -Nocturnal symptom frequency: once a month  -Limitation of activity: no -Current upper respiratory symptoms: no -Triggers: Allergies including tree and flower pollen, worse in the spring and fall -Pneumovax: unknown -Influenza:  Due today  Allergies: -Follows with allergist and undergoes allergy vaccines regularly, last had yesterday -Currently on Singulair and Xyzal - takes during spring and fall when allergies are worse  -Has Epi-pen - has an issue with pecans in the past  -Also uses Azelastine-Flonase spray as well   Health Maintenance: -Blood work due -Tdap and flu due  Outpatient Encounter Medications as of 07/23/2022  Medication Sig   azelastine (OPTIVAR) 0.05 % ophthalmic solution Apply to eye.   Azelastine-Fluticasone 137-50 MCG/ACT SUSP Place 1 spray into both nostrils 2 (two) times daily.   BREO ELLIPTA 200-25 MCG/INH AEPB Inhale 1 puff into the lungs daily.   EPINEPHrine 0.3 mg/0.3 mL IJ SOAJ injection See admin instructions.   levocetirizine (XYZAL) 5 MG tablet SMARTSIG:1 Tablet(s) By Mouth Every Evening   montelukast (SINGULAIR) 10 MG tablet Take 1 tablet by mouth daily.   [DISCONTINUED] Adalimumab (HUMIRA PEN-CD/UC/HS STARTER) 40 MG/0.8ML PNKT Inject 160mg  on day 1 SQ  and then on day 15 Insert 80mg  SQ   [DISCONTINUED] Adalimumab (HUMIRA) 40 MG/0.4ML PSKT Inject 1 pen into the skin every 14 (fourteen)  days.   [DISCONTINUED] budesonide (ENTOCORT EC) 3 MG 24 hr capsule TAKE 3 CAPSULES (9 MG TOTAL) BY MOUTH DAILY.   [DISCONTINUED] ondansetron (ZOFRAN-ODT) 4 MG disintegrating tablet Take 1 tablet (4 mg total) by mouth every 8 (eight) hours as needed.   No facility-administered encounter medications on file as  of 07/23/2022.    Past Medical History:  Diagnosis Date   Allergy    Asthma    Crohn's disease [facility])     Past Surgical History:  Procedure Laterality Date   COLONOSCOPY WITH PROPOFOL N/A 04/11/2021   Procedure: COLONOSCOPY WITH PROPOFOL;  Surgeon: Lin Landsman, MD;  Location: [facility] ENDOSCOPY;  Service: Endoscopy;  Laterality: N/A;    Family History  Problem Relation Age of Onset   Anxiety disorder Mother    Hypertension Father    Stroke Father    Anxiety disorder Brother     Social History   Socioeconomic History   Marital status: Married    Spouse name: Not on file   Number of children: Not on file   Years of education: Not on file   Highest education level: Not on file  Occupational History   Not on file  Tobacco Use   Smoking status: Never   Smokeless tobacco: Never  Vaping Use   Vaping Use: Never used  Substance and Sexual Activity   Alcohol use: Yes    Comment: occ   Drug use: Never   Sexual activity: Yes  Other Topics Concern   Not on file  Social History Narrative   Not on file   Social Determinants of Health   Financial Resource Strain: Not on file  Food Insecurity: Not on file  Transportation Needs: Not on file  Physical Activity: Not on file  Stress: Not on file  Social Connections: Not on file  Intimate Partner Violence: Not on file    Review of Systems  Constitutional:  Negative for chills and fever.  Eyes:  Negative for blurred vision.  Respiratory:  Negative for cough, shortness of breath and wheezing.   Cardiovascular:  Negative for chest pain, palpitations and leg swelling.  Gastrointestinal:  Negative for abdominal pain, blood in stool, constipation, diarrhea and melena.  Neurological:  Positive for dizziness. Negative for loss of consciousness and headaches.      Objective    BP 126/80   Pulse 96   Temp 97.7 F (36.5 C)   Resp 16   Ht 5\' 10"  (1.778 m)   Wt 194 lb 11.2 oz (88.3 kg)   SpO2 96%   BMI 27.94 kg/m    Physical Exam      Assessment & Plan:   1. Crohn's disease of large intestine without complication ([place]): Is no longer seeing GI but has not had any flares in about a year. Reviewed CT scan and colonoscopy from 2022. He is currently not on any medication. Will obtain routine labs but recommend he be established with GI. He will call his insurance and do some research about where he would like to be referred to for this.   - CBC w/Diff/Platelet - COMPLETE METABOLIC PANEL WITH GFR - Vitamin D (25 hydroxy)  2. Moderate persistent asthma, uncomplicated: Stable, currently on Breo-Ellipta 200-25 mg and Albuterol PRN.  3. Environmental allergies: Following with allergist, has consistent allergy shots. Takes Xyzal, Singulair as well as Flonase and Azelastine during the spring and fall.   4. Hidradenitis suppurativa: No symptoms currently, discussed avoiding tight clothing and keeping clean and dry. Clindamycin  ointment sent to use as needed. Continue to monitor.   - clindamycin (CLINDAGEL) 1 % gel; Apply topically 2 (two) times daily.  Dispense: 30 g; Refill: 0  5. Vasovagal near syncope: Symptoms consistent with vasovagal pre-syncope. Labs today but vitals signs normal. Discussed staying well hydrated and avoiding becoming over-heated. Literature provided.   - CBC w/Diff/Platelet - COMPLETE METABOLIC PANEL WITH GFR  6. Lipid screening: Lipid screening today.   - Lipid Profile  7. Vitamin D deficiency: Rule out deficiency in the setting of Crohn's disease.   - Vitamin D (25 hydroxy)  8. Need for influenza vaccination/Need for Tdap vaccination: Flu and Tdap vaccines administered today.   - Flu Vaccine QUAD 6+ mos PF IM (Fluarix Quad PF) - Tdap vaccine greater than or equal to 7yo IM    Return in about 6 months (around 01/22/2023).   Teodora Medici, DO

## 2022-07-23 NOTE — Patient Instructions (Addendum)
It was great seeing you today!  Plan discussed at today's visit: -Blood work ordered today, results will be uploaded to [facility].  -Tdap and flu vaccine today -Antibiotic ointment sent to pharmacy to use as needed for cysts  -Obtain a blood pressure cuff to monitor during symptoms - work on staying well hydrated   Follow up in: 6 months   Take care and let us know if you have any questions or concerns prior to your next visit.  Dr. Rosana Berger  Hidradenitis Suppurativa Hidradenitis suppurativa is a long-term (chronic) skin disease. It is similar to a severe form of acne, but it affects areas of the body where acne would be unusual, especially areas of the body where skin rubs against skin and becomes moist. These include: Underarms. Groin. Genital area. Buttocks. Upper thighs. Breasts. Hidradenitis suppurativa may start out as small lumps or pimples caused by blocked skin pores, sweat glands, or hair follicles. Pimples may develop into deep sores that break open (rupture) and drain pus. Over time, affected areas of skin may thicken and become scarred. This condition is rare and does not spread from person to person (non-contagious). What are the causes? The exact cause of this condition is not known. It may be related to: Male and male hormones. An overactive disease-fighting system (immune system). The immune system may over-react to blocked hair follicles or sweat glands and cause swelling and pus-filled sores. What increases the risk? You are more likely to develop this condition if you: Are male. Are 49-66 years old. Have a family history of hidradenitis suppurativa. Have a personal history of acne. Are overweight. Smoke. Take the medicine lithium. What are the signs or symptoms? The first symptoms are usually painful bumps in the skin, similar to pimples. The condition may get worse over time (progress), or it may only cause mild symptoms. If the disease progresses, symptoms  may include: Skin bumps getting bigger and growing deeper into the skin. Bumps rupturing and draining pus. Itchy, infected skin. Skin getting thicker and scarred. Tunnels under the skin (fistulas) where pus drains from a bump. Pain during daily activities, such as pain during walking if your groin area is affected. Emotional problems, such as stress or depression. This condition may affect your appearance and your ability or willingness to wear certain clothes or do certain activities. How is this diagnosed? This condition is diagnosed by a health care provider who specializes in skin conditions (dermatologist). You may be diagnosed based on: Your symptoms and medical history. A physical exam. Testing a pus sample for infection. Blood tests. How is this treated? Your treatment will depend on how severe your symptoms are. The same treatment will not work for everybody with this condition. You may need to try several treatments to find what works best for you. Treatment may include: Cleaning and bandaging (dressing) your wounds as needed. Lifestyle changes, such as new skin care routines. Taking medicines, such as: Antibiotics. Acne medicines. Medicines to reduce the activity of the immune system. A diabetes medicine (metformin). Birth control pills, for women. Steroids to reduce swelling and pain. Working with a mental health care provider, if you experience emotional distress due to this condition. If you have severe symptoms that do not get better with medicine, you may need surgery. Surgery may involve: Using a laser to clear the skin and remove hair follicles. Opening and draining deep sores. Removing the areas of skin that are diseased and scarred. Follow these instructions at home: Medicines  Take over-the-counter  and prescription medicines only as told by your health care provider. If you were prescribed antibiotics, take them as told by your health care provider. Do not stop  using the antibiotic even if your condition improves. Skin care If you have open wounds, cover them with a clean dressing as told by your health care provider. Keep wounds clean by washing them gently with soap and water when you bathe. Do not shave the areas where you get hidradenitis suppurativa. Wear loose-fitting clothes. Try to avoid getting overheated or sweaty. If you get sweaty or wet, change into clean, dry clothes as soon as you can. To help relieve pain and itchiness, cover sore areas with a warm, clean washcloth (warm compress) for 5-10 minutes as often as needed. Your healthcare provider may recommend an antiperspirant deodorant that may be gentle on your skin. A daily antiseptic wash to cleanse affected areas may be suggested by your healthcare provider. General instructions Learn as much as you can about your disease so that you have an active role in your treatment. Work closely with your health care provider to find treatments that work for you. If you are overweight, work with your health care provider to lose weight as recommended. Do not use any products that contain nicotine or tobacco. These products include cigarettes, chewing tobacco, and vaping devices, such as e-cigarettes. If you need help quitting, ask your health care provider. If you struggle with living with this condition, talk with your health care provider or work with a mental health care provider as recommended. Keep all follow-up visits. Where to find more information Hidradenitis Suppurativa Foundation, Inc.: www.hs-foundation.org American Academy of Dermatology: InfoExam.si Contact a health care provider if: You have a flare-up of hidradenitis suppurativa. You have a fever or chills. You have trouble controlling your symptoms at home. You have trouble doing your daily activities because of your symptoms. You have trouble dealing with emotional problems related to your condition. Summary Hidradenitis  suppurativa is a long-term (chronic) skin disease. It is similar to a severe form of acne, but it affects areas of the body where acne would be unusual. The first symptoms are usually painful bumps in the skin, similar to pimples. The condition may only cause mild symptoms, or it may get worse over time (progress). If you have open wounds, cover them with a clean dressing as told by your health care provider. Keep wounds clean by washing them gently with soap and water when you bathe. Besides skin care, treatment may include medicines, laser treatment, and surgery. This information is not intended to replace advice given to you by your health care provider. Make sure you discuss any questions you have with your health care provider. Document Revised: 11/13/2021 Document Reviewed: 11/13/2021 Elsevier Patient Education  2023 Else[employer] Inc.  Syncope, Adult  Syncope refers to a condition in which a person temporarily loses consciousness. Syncope may also be called fainting or passing out. It is caused by a sudden decrease in blood flow to the brain. This can happen for a variety of reasons. Most causes of syncope are not dangerous. It can be triggered by things such as needle sticks, seeing blood, pain, or intense emotion. However, syncope can also be a sign of a serious medical problem, such as a heart abnormality. Other causes can include dehydration, migraines, or taking medicines that lower blood pressure. Your health care provider may do tests to find the reason why you are having syncope. If you faint, get medical help right  away. Call your local emergency services (911 in the [place] Follow these instructions at home: Pay attention to any changes in your symptoms. Take these actions to stay safe and to help relieve your symptoms: Knowing when you may be about to faint Signs that you may be about to faint include: Feeling dizzy, weak, light-headed, or like the room is spinning. Feeling  nauseous. Seeing spots or seeing all white or all black in your field of vision. Having cold, clammy skin or feeling warm and sweaty. Hearing ringing in the ears (tinnitus). If you start to feel like you might faint, sit or lie down right away. If sitting, put your head down between your legs. If lying down, raise (elevate) your feet above the level of your heart. Breathe deeply and steadily. Wait until all the symptoms have passed. Have someone stay with you until you feel stable. Medicines Take over-the-counter and prescription medicines only as told by your health care provider. If you are taking blood pressure or heart medicine, get up slowly and take several minutes to sit and then stand. This can reduce dizziness and decrease the risk of syncope. Lifestyle Do not drive, use machinery, or play sports until your health care provider says it is okay. Do not drink alcohol. Do not use any products that contain nicotine or tobacco. These products include cigarettes, chewing tobacco, and vaping devices, such as e-cigarettes. If you need help quitting, ask your health care provider. Avoid hot tubs and saunas. General instructions Talk with your health care provider about your symptoms. You may need to have testing to understand the cause of your syncope. Drink enough fluid to keep your urine pale yellow. Avoid prolonged standing. If you must stand for a long time, do movements such as: Moving your legs. Crossing your legs. Flexing and stretching your leg muscles. Squatting. Keep all follow-up visits. This is important. Contact a health care provider if: You have episodes of near fainting. Get help right away if: You faint. You hit your head or are injured after fainting. You have any of these symptoms that may indicate trouble with your heart: Fast or irregular heartbeats (palpitations). Unusual pain in your chest, abdomen, or back. Shortness of breath. You have a seizure. You have a  severe headache. You are confused. You have vision problems. You have severe weakness or trouble walking. You are bleeding from your mouth or rectum, or you have black or tarry stool. These symptoms may represent a serious problem that is an emergency. Do not wait to see if your symptoms will go away. Get medical help right away. Call your local emergency services (911 in the [place] Do not drive yourself to the hospital. Summary Syncope refers to a condition in which a person temporarily loses consciousness. Syncope may also be called fainting or passing out. It is caused by a sudden decrease in blood flow to the brain. Signs that you may be about to faint include dizziness, feeling light-headed, feeling nauseous, sudden vision changes, or cold, clammy skin. Even though most causes of syncope are not dangerous, syncope can be a sign of a serious medical problem. Get help right away if you faint. If you start to feel like you might faint, sit or lie down right away. If sitting, put your head down between your legs. If lying down, raise (elevate) your feet above the level of your heart. This information is not intended to replace advice given to you by your health care provider. Make sure you  discuss any questions you have with your health care provider. Document Revised: 01/31/2021 Document Reviewed: 01/31/2021 Elsevier Patient Education  2023 [employer].

## 2022-07-24 LAB — COMPLETE METABOLIC PANEL WITH GFR
AG Ratio: 1.8 (calc) (ref 1.0–2.5)
ALT: 22 U/L (ref 9–46)
AST: 14 U/L (ref 10–40)
Albumin: 4.9 g/dL (ref 3.6–5.1)
Alkaline phosphatase (APISO): 72 U/L (ref 36–130)
BUN: 17 mg/dL (ref 7–25)
CO2: 26 mmol/L (ref 20–32)
Calcium: 10 mg/dL (ref 8.6–10.3)
Chloride: 104 mmol/L (ref 98–110)
Creat: 0.82 mg/dL (ref 0.60–1.24)
Globulin: 2.8 g/dL (calc) (ref 1.9–3.7)
Glucose, Bld: 84 mg/dL (ref 65–99)
Potassium: 4.8 mmol/L (ref 3.5–5.3)
Sodium: 139 mmol/L (ref 135–146)
Total Bilirubin: 0.5 mg/dL (ref 0.2–1.2)
Total Protein: 7.7 g/dL (ref 6.1–8.1)
eGFR: 123 mL/min/{1.73_m2} (ref >=60)

## 2022-07-24 LAB — CBC WITH DIFFERENTIAL/PLATELET
Absolute Monocytes: 608 cells/uL (ref 200–950)
Basophils Absolute: 30 cells/uL (ref 0–200)
Basophils Relative: 0.4 %
Eosinophils Absolute: 98 cells/uL (ref 15–500)
Eosinophils Relative: 1.3 %
HCT: 46.8 % (ref 38.5–50.0)
Hemoglobin: 16.3 g/dL (ref 13.2–17.1)
Lymphs Abs: 2820 cells/uL (ref 850–3900)
MCH: 29.7 pg (ref 27.0–33.0)
MCHC: 34.8 g/dL (ref 32.0–36.0)
MCV: 85.4 fL (ref 80.0–100.0)
MPV: 10.8 fL (ref 7.5–12.5)
Monocytes Relative: 8.1 %
Neutro Abs: 3945 cells/uL (ref 1500–7800)
Neutrophils Relative %: 52.6 %
Platelets: 266 10*3/uL (ref 140–400)
RBC: 5.48 10*6/uL (ref 4.20–5.80)
RDW: 12.2 % (ref 11.0–15.0)
Total Lymphocyte: 37.6 %
WBC: 7.5 10*3/uL (ref 3.8–10.8)

## 2022-07-24 LAB — LIPID PANEL
Cholesterol: 166 mg/dL (ref <200)
HDL: 33 mg/dL — ABNORMAL LOW (ref >=40)
LDL Cholesterol (Calc): 98 mg/dL (calc)
Non-HDL Cholesterol (Calc): 133 mg/dL (calc) — ABNORMAL HIGH (ref <130)
Total CHOL/HDL Ratio: 5 (calc) — ABNORMAL HIGH (ref <5.0)
Triglycerides: 231 mg/dL — ABNORMAL HIGH (ref <150)

## 2022-07-24 LAB — VITAMIN D 25 HYDROXY (VIT D DEFICIENCY, FRACTURES): Vit D, 25-Hydroxy: 17 ng/mL — ABNORMAL LOW (ref 30–100)

## 2022-07-24 MED ORDER — VITAMIN D (ERGOCALCIFEROL) 1.25 MG (50000 UNIT) PO CAPS: 50000.0000 [IU] | ORAL_CAPSULE | ORAL | 0 refills | Status: AC

## 2022-07-24 NOTE — Addendum Note (Signed)
Addended by: Teodora Medici on: 07/24/2022 09:12 AM   Modules accepted: Orders

## 2023-01-26 NOTE — Progress Notes (Unsigned)
Established Patient Office Visit  Subjective    Patient ID: Ian Pena, male    DOB: Jul 28, 1996  Age: 27 y.o. MRN: [id number]  CC:  No chief complaint on file.   HPI [name] presents to follow up on chronic medical conditions.  The first of which is boils/cysts he occasionally gets in his groin area. This started when he was in college. It happens once every few months. He will get one at a time but they can become large and painful. Sometimes purulent material can be expressed, other times just blood. They last for a few days and will resolve on their own usually.   He also has lightheadedness, dizziness and feels like he is going to pass out episodically. He feels this way in hot showers, hot tubs but mostly prior to sexual activity. He denies symptoms after ejaculation. This has occurred about 5 separate times. He has not fully lost consciousness but feels like he is going to.   Crohn's Diease: -Had been following with GI, last seen 05/20/21.  -Was formally diagnosed in the summer 2022, however he had been having episodes of lower abdominal pain and severe vomiting multiple times throughout his early 26s. -CT abdomen and pelvis from 03/07/2021 showing inflammation at the terminal ileum, colonoscopy confirmed stricturing phenotype. -Colonoscopy from 04/11/2021 showing strictures inflammation -Had been on Budesonide for symptoms but is no longer on this because he felt like symptoms were worse on the medication. -He is not currently on any treatment for Crohn's disease.  He states occasionally he will have a cramping right lower quadrant pain but overall no changes in bowel movements, caliber of stools.  He does occasionally have small amount of blood on the toilet paper but denies dark or bloody stools.  Asthma:  -Asthma status: controlled -Current Treatments: Breo-Ellipta daily, Albuterol PRN -Satisfied with current treatment?: yes -Albuterol/rescue inhaler frequency: rarely,  most with spring or with exercise  -Dyspnea frequency: Rarely -Wheezing frequency: Rarely -Cough frequency: Rarely -Nocturnal symptom frequency: once a month  -Limitation of activity: no -Current upper respiratory symptoms: no -Triggers: Allergies including tree and flower pollen, worse in the spring and fall -Pneumovax: unknown -Influenza:  Due today  Allergies: -Follows with allergist and undergoes allergy vaccines regularly, last had yesterday -Currently on Singulair and Xyzal - takes during spring and fall when allergies are worse  -Has Epi-pen - has an issue with pecans in the past  -Also uses Azelastine-Flonase spray as well   Health Maintenance: -Blood work UTD  Outpatient Encounter Medications as of 01/27/2023  Medication Sig   azelastine (OPTIVAR) 0.05 % ophthalmic solution Apply to eye.   Azelastine-Fluticasone 137-50 MCG/ACT SUSP Place 1 spray into both nostrils 2 (two) times daily.   BREO ELLIPTA 200-25 MCG/INH AEPB Inhale 1 puff into the lungs daily.   clindamycin (CLINDAGEL) 1 % gel Apply topically 2 (two) times daily.   EPINEPHrine 0.3 mg/0.3 mL IJ SOAJ injection See admin instructions.   levocetirizine (XYZAL) 5 MG tablet SMARTSIG:1 Tablet(s) By Mouth Every Evening   montelukast (SINGULAIR) 10 MG tablet Take 1 tablet by mouth daily.   Vitamin D, Ergocalciferol, (DRISDOL) 1.25 MG (50000 UNIT) CAPS capsule Take 1 capsule (50,000 Units total) by mouth every 7 (seven) days.   No facility-administered encounter medications on file as of 01/27/2023.    Past Medical History:  Diagnosis Date   Allergy    Asthma    Crohn's disease     Past Surgical History:  Procedure Laterality Date  COLONOSCOPY WITH PROPOFOL N/A 04/11/2021   Procedure: COLONOSCOPY WITH PROPOFOL;  Surgeon: Toney Reil, MD;  Location: [facility] ENDOSCOPY;  Service: Endoscopy;  Laterality: N/A;    Family History  Problem Relation Age of Onset   Anxiety disorder Mother    Hypertension Father     Stroke Father    Anxiety disorder Brother     Social History   Socioeconomic History   Marital status: Married    Spouse name: Not on file   Number of children: Not on file   Years of education: Not on file   Highest education level: Bachelor's degree (e.g., BA, AB, BS)  Occupational History   Not on file  Tobacco Use   Smoking status: Never   Smokeless tobacco: Never  Vaping Use   Vaping Use: Never used  Substance and Sexual Activity   Alcohol use: Yes    Comment: occ   Drug use: Never   Sexual activity: Yes  Other Topics Concern   Not on file  Social History Narrative   Not on file   Social Determinants of Health   Financial Resource Strain: Low Risk  (01/26/2023)   Overall Financial Resource Strain (CARDIA)    Difficulty of Paying Living Expenses: Not hard at all  Food Insecurity: No Food Insecurity (01/26/2023)   Hunger Vital Sign    Worried About Running Out of Food in the Last Year: Never true    Ran Out of Food in the Last Year: Never true  Transportation Needs: No Transportation Needs (01/26/2023)   PRAPARE - Administrator, Civil Service (Medical): No    Lack of Transportation (Non-Medical): No  Physical Activity: Insufficiently Active (01/26/2023)   Exercise Vital Sign    Days of Exercise per Week: 3 days    Minutes of Exercise per Session: 20 min  Stress: No Stress Concern Present (01/26/2023)   [employer] of Occupational Health - Occupational Stress Questionnaire    Feeling of Stress : Only a little  Social Connections: Socially Integrated (01/26/2023)   Social Connection and Isolation Panel [NHANES]    Frequency of Communication with Friends and Family: More than three times a week    Frequency of Social Gatherings with Friends and Family: Once a week    Attends Religious Services: More than 4 times per year    Active Member of [employer] or Organizations: Yes    Attends Engineer, structural: More than 4 times per year    Marital  Status: Married  Catering manager Violence: Not on file    Review of Systems  Constitutional:  Negative for chills and fever.  Eyes:  Negative for blurred vision.  Respiratory:  Negative for cough, shortness of breath and wheezing.   Cardiovascular:  Negative for chest pain, palpitations and leg swelling.  Gastrointestinal:  Negative for abdominal pain, blood in stool, constipation, diarrhea and melena.  Neurological:  Positive for dizziness. Negative for loss of consciousness and headaches.      Objective    There were no vitals taken for this visit.  Physical Exam      Assessment & Plan:   1. Crohn's disease of large intestine without complication (HCC): Is no longer seeing GI but has not had any flares in about a year. Reviewed CT scan and colonoscopy from 2022. He is currently not on any medication. Will obtain routine labs but recommend he be established with GI. He will call his insurance and do some research about where he  would like to be referred to for this.   - CBC w/Diff/Platelet - COMPLETE METABOLIC PANEL WITH GFR - Vitamin D (25 hydroxy)  2. Moderate persistent asthma, uncomplicated: Stable, currently on Breo-Ellipta 200-25 mg and Albuterol PRN.  3. Environmental allergies: Following with allergist, has consistent allergy shots. Takes Xyzal, Singulair as well as Flonase and Azelastine during the spring and fall.   4. Hidradenitis suppurativa: No symptoms currently, discussed avoiding tight clothing and keeping clean and dry. Clindamycin ointment sent to use as needed. Continue to monitor.   - clindamycin (CLINDAGEL) 1 % gel; Apply topically 2 (two) times daily.  Dispense: 30 g; Refill: 0  5. Vasovagal near syncope: Symptoms consistent with vasovagal pre-syncope. Labs today but vitals signs normal. Discussed staying well hydrated and avoiding becoming over-heated. Literature provided.   - CBC w/Diff/Platelet - COMPLETE METABOLIC PANEL WITH GFR  6. Lipid  screening: Lipid screening today.   - Lipid Profile  7. Vitamin D deficiency: Rule out deficiency in the setting of Crohn's disease.   - Vitamin D (25 hydroxy)  8. Need for influenza vaccination/Need for Tdap vaccination: Flu and Tdap vaccines administered today.   - Flu Vaccine QUAD 6+ mos PF IM (Fluarix Quad PF) - Tdap vaccine greater than or equal to 7yo IM    No follow-ups on file.   Margarita Mail, DO

## 2023-01-27 ENCOUNTER — Encounter: Payer: Self-pay | Admitting: Internal Medicine

## 2023-01-27 ENCOUNTER — Ambulatory Visit (INDEPENDENT_AMBULATORY_CARE_PROVIDER_SITE_OTHER): Payer: BLUE CROSS/BLUE SHIELD | Admitting: Internal Medicine

## 2023-01-27 VITALS — BP 122/68 | HR 86 | Temp 97.7°F | Resp 18 | Ht 70.0 in | Wt 195.9 lb

## 2023-01-27 DIAGNOSIS — Z9109 Other allergy status, other than to drugs and biological substances: Secondary | ICD-10-CM

## 2023-01-27 DIAGNOSIS — K5010 Crohn's disease of large intestine without complications: Secondary | ICD-10-CM

## 2023-01-27 DIAGNOSIS — J351 Hypertrophy of tonsils: Secondary | ICD-10-CM

## 2023-01-27 DIAGNOSIS — L732 Hidradenitis suppurativa: Secondary | ICD-10-CM

## 2023-01-27 DIAGNOSIS — J4540 Moderate persistent asthma, uncomplicated: Secondary | ICD-10-CM

## 2023-07-28 NOTE — Progress Notes (Deleted)
Established Patient Office Visit  Subjective    Patient ID: Ian Pena, male    DOB: 1995-11-27  Age: 27 y.o. MRN: [id number]  CC:  No chief complaint on file.   HPI [name] presents to follow up on chronic medical conditions. Was seen by his dentist and was told that his tonsils are enlarged. Does snore at night, no witnesses apneic events.   The first of which is boils/cysts he occasionally gets in his groin area. This started when he was in college. It happens once every few months. He will get one at a time but they can become large and painful. Sometimes purulent material can be expressed, other times just blood. They last for a few days and will resolve on their own usually. Has not had many flares since last visit, using Clindamycin cream.  Crohn's Diease: -Had been following with GI, last seen 05/20/21.  -Was formally diagnosed in the summer 2022, however he had been having episodes of lower abdominal pain and severe vomiting multiple times throughout his early 65s. -CT abdomen and pelvis from 03/07/2021 showing inflammation at the terminal ileum, colonoscopy confirmed stricturing phenotype. -Colonoscopy from 04/11/2021 showing strictures inflammation -Had been on Budesonide for symptoms but is no longer on this because he felt like symptoms were worse on the medication. -He is not currently on any treatment for Crohn's disease.   -Has occasional lower abdominal pains, about 2-3 times in the last 6 months. No changes in stool caliber and no blood in the stools.   Asthma:  -Asthma status: controlled -Current Treatments: Breo-Ellipta daily, Albuterol PRN -Satisfied with current treatment?: yes -Albuterol/rescue inhaler frequency: rarely, most with spring or with exercise  -Dyspnea frequency: Rarely -Wheezing frequency: Rarely -Cough frequency: Rarely -Nocturnal symptom frequency: once a month  -Limitation of activity: no -Current upper respiratory symptoms:  no -Triggers: Allergies including tree and flower pollen, worse in the spring and fall -Pneumovax: unknown -Influenza: UTD  Allergies: -Follows with allergist and undergoes allergy shots -Currently on Singulair and Xyzal - takes during spring and fall when allergies are worse  -Has Epi-pen - has an issue with pecans in the past  -Also uses Azelastine-Flonase spray as well   Health Maintenance: -Blood work UTD  Outpatient Encounter Medications as of 07/31/2023  Medication Sig   azelastine (OPTIVAR) 0.05 % ophthalmic solution Apply to eye.   Azelastine-Fluticasone 137-50 MCG/ACT SUSP Place 1 spray into both nostrils 2 (two) times daily.   BREO ELLIPTA 200-25 MCG/INH AEPB Inhale 1 puff into the lungs daily.   clindamycin (CLINDAGEL) 1 % gel Apply topically 2 (two) times daily.   EPINEPHrine 0.3 mg/0.3 mL IJ SOAJ injection See admin instructions.   levocetirizine (XYZAL) 5 MG tablet SMARTSIG:1 Tablet(s) By Mouth Every Evening   montelukast (SINGULAIR) 10 MG tablet Take 1 tablet by mouth daily.   Vitamin D, Ergocalciferol, (DRISDOL) 1.25 MG (50000 UNIT) CAPS capsule Take 1 capsule (50,000 Units total) by mouth every 7 (seven) days.   No facility-administered encounter medications on file as of 07/31/2023.    Past Medical History:  Diagnosis Date   Allergy    Asthma    Crohn's disease [facility])     Past Surgical History:  Procedure Laterality Date   COLONOSCOPY WITH PROPOFOL N/A 04/11/2021   Procedure: COLONOSCOPY WITH PROPOFOL;  Surgeon: Toney Reil, MD;  Location: [facility] ENDOSCOPY;  Service: Endoscopy;  Laterality: N/A;    Family History  Problem Relation Age of Onset   Anxiety disorder Mother  Hypertension Father    Stroke Father    Anxiety disorder Brother     Social History   Socioeconomic History   Marital status: Married    Spouse name: Not on file   Number of children: Not on file   Years of education: Not on file   Highest education level: Bachelor's  degree (e.g., BA, AB, BS)  Occupational History   Not on file  Tobacco Use   Smoking status: Never   Smokeless tobacco: Never  Vaping Use   Vaping status: Never Used  Substance and Sexual Activity   Alcohol use: Yes    Comment: occ   Drug use: Never   Sexual activity: Yes  Other Topics Concern   Not on file  Social History Narrative   Not on file   Social Determinants of Health   Financial Resource Strain: Low Risk  (01/26/2023)   Overall Financial Resource Strain (CARDIA)    Difficulty of Paying Living Expenses: Not hard at all  Food Insecurity: No Food Insecurity (01/26/2023)   Hunger Vital Sign    Worried About Running Out of Food in the Last Year: Never true    Ran Out of Food in the Last Year: Never true  Transportation Needs: No Transportation Needs (01/26/2023)   PRAPARE - Administrator, Civil Service (Medical): No    Lack of Transportation (Non-Medical): No  Physical Activity: Insufficiently Active (01/26/2023)   Exercise Vital Sign    Days of Exercise per Week: 3 days    Minutes of Exercise per Session: 20 min  Stress: No Stress Concern Present (01/26/2023)   [employer] of Occupational Health - Occupational Stress Questionnaire    Feeling of Stress : Only a little  Social Connections: Socially Integrated (01/26/2023)   Social Connection and Isolation Panel [NHANES]    Frequency of Communication with Friends and Family: More than three times a week    Frequency of Social Gatherings with Friends and Family: Once a week    Attends Religious Services: More than 4 times per year    Active Member of [employer] or Organizations: Yes    Attends Engineer, structural: More than 4 times per year    Marital Status: Married  Catering manager Violence: Not on file    Review of Systems  Constitutional:  Negative for chills and fever.  Eyes:  Negative for blurred vision.  Respiratory:  Negative for cough, shortness of breath and wheezing.    Cardiovascular:  Negative for chest pain, palpitations and leg swelling.  Gastrointestinal:  Negative for abdominal pain, blood in stool, constipation, diarrhea and melena.  Neurological:  Negative for dizziness and headaches.      Objective    There were no vitals taken for this visit.  Physical Exam Constitutional:      Appearance: Normal appearance.  HENT:     Head: Normocephalic and atraumatic.     Mouth/Throat:     Mouth: Mucous membranes are moist.     Pharynx: Oropharynx is clear.     Comments: Enlarged tonsils, grade 3 Neurological:     Mental Status: He is alert.         Assessment & Plan:   1. Crohn's disease of large intestine without complication: Stable, no current symptoms and not on any medications.  Referral placed to GI to establish care.  - Ambulatory referral to Gastroenterology  2. 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  Will place a referral to ENT for evaluation and to see if he is a surgical candidate.  - Ambulatory referral to ENT  3. Hidradenitis suppurativa: Stable, no severe flares since last visit.  Does use clindamycin ointment as needed.  4. Moderate persistent asthma, uncomplicated: Stable, doing well with Breo Ellipta and albuterol as needed.  5. Environmental allergies: Stable, currently taking Singulair and Xyzal.   No follow-ups on file.   Margarita Mail, DO

## 2023-07-31 ENCOUNTER — Ambulatory Visit: Payer: BLUE CROSS/BLUE SHIELD | Admitting: Internal Medicine
